# Patient Record
Sex: Female | Born: 1950 | Race: White | Hispanic: No
Health system: Midwestern US, Academic
[De-identification: ages and names within clinical notes are randomized; demographics above are authoritative.]

---

## 2017-06-04 ENCOUNTER — Encounter: Admit: 2017-06-04 | Discharge: 2017-06-04 | Payer: MEDICARE | Primary: Family

## 2017-06-04 ENCOUNTER — Ambulatory Visit: Admit: 2017-06-04 | Discharge: 2017-06-05 | Payer: MEDICARE | Primary: Family

## 2017-06-04 DIAGNOSIS — I10 Essential (primary) hypertension: Principal | ICD-10-CM

## 2017-06-04 DIAGNOSIS — R002 Palpitations: ICD-10-CM

## 2017-06-04 DIAGNOSIS — I2510 Atherosclerotic heart disease of native coronary artery without angina pectoris: ICD-10-CM

## 2017-06-04 DIAGNOSIS — I745 Embolism and thrombosis of iliac artery: ICD-10-CM

## 2017-06-04 NOTE — Progress Notes
Date of Service: 06/04/2017    Joanne Flynn is a 66 y.o. female.       HPI     Joanne Flynn is followed for coronary artery disease and labile hypertension.??? In the past she has had a tendency to have labile hypertension and her blood pressure tends to increase when she is under stress or when she has episodic left mid abdominal discomfort from time to time.  She is now tolerating ramipril 2.5 mg daily without difficulty.  Her blood pressure averaged tends to be very good with a systolic blood pressure of approximately 120 mmHg.  However, she still reports ranges where her systolic blood pressure may be 90 or may be 140 mmHg.  Otherwise, over the past 6 months, Joanne Flynn has been doing well and she reports no angina, congestive symptoms, sensation of sustained forceful heart pounding, lightheadedness or syncope.  She reports momentary, chronic, mild palpitations mostly related to anxiety.  She reports that her exercise tolerance has been stable but I do not believe that she has been walking much for exercise recently.???  She states that the greatest limitation for regular exercise is her chronic low back discomfort.  I am not sure that walking bothers her low back discomfort very much.  The patient reports no myalgias, bleeding abnormalities, neurologic motor abnormalities or difficulty with speech.   ???  Historically, in May 2002 Joanne Flynn presented with chest discomfort and underwent coronary angiography on 12/04/2000. This revealed a 50 percent to 60 percent stenosis in the obtuse marginal branch of the left circumflex coronary artery which did not require intervention. 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 She underwent right iliac thrombectomy and patch angioplasty of the right common femoral artery. When I saw her in April of 2009 she described episodic tiredness and fatigue with certain activities such as mowing four acres. An exercise echo stress test was performed at that time which revealed no evidence for ischemia. ???  When I saw Joanne Flynn on 09/04/09 she reported a chronic hurting in her cervical neck area, upper back and right shoulder. It gradually improved with physical therapy. A regadenoson Sestamibi stress study was performed in April 2011 and was reported to be probably normal. It demonstrates a small area of partially fixed, partially reversible basal mid inferoseptal decreased tracer uptake. There is increased intensity extracardiac uptake noted which may limit the counts in the inferior segments.   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 She reports that was diagnosed with another basal cell carcinoma on her right thigh and received radiation therapy. She was seen in the Emergency room on 01/19/13 for circumferential head pressure and was noted to be hypertensive. She was given amlodipine 5 mg for a one time dose. 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 She has received steroid injections for A Dupuytren's contracture. ???I notice that she was seen in urgent care on 08/03/14 for head congestion, on 08/23/14 for bronchitis and on 09/30/14 for sinus drainage and fever. Joanne Flynn was seen in Urgent Care on 04/16/15 for hypertension. She reports that she had undergone evaluation for her abdominal discomfort but no specific diagnosis has been found. Her abdomen is tender when she has the discomfort and it is possible that it  is related to diverticulitis since she reports a known diagnosis of diverticulosis.          Vitals:    06/04/17 0905 06/04/17 0918   BP: 152/82 142/84   Pulse: 72    Weight: 56.4 kg (124 lb 6.4 oz)    Height: 1.651 m (5' 5)      Body mass index is 20.7 kg/m???. Past Medical History  Patient Active Problem List    Diagnosis Date Noted   ??? Coronary artery disease 03/14/2009     May 2002 presented with chest discomfort and underwent coronary angiography on 12/04/2000. This revealed a 50-60 percent stenosis in the obtuse marginal branch of the left circumflex coronary artery which did not require intervention. Post procedure complication with total occlusion of the right external iliac artery which required surgical repair.     ??? Palpitations 03/14/2009   ??? 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     ??? History of tobacco use 03/14/2009         Review of Systems   Constitution: Negative.   HENT: Negative.    Eyes: Negative.    Cardiovascular: Negative.    Respiratory: Positive for sputum production.    Endocrine: Positive for cold intolerance.   Hematologic/Lymphatic: Bruises/bleeds easily.   Skin: Negative.    Musculoskeletal: Positive for arthritis, back pain and neck pain.   Gastrointestinal: Negative.    Genitourinary: Negative.    Neurological: Negative.    Psychiatric/Behavioral: Negative.    Allergic/Immunologic: Negative.        Physical Exam  GENERAL: The patient is well developed, well nourished, resting comfortably and in no distress. ???  HEENT: No facial abnormalities noted.  NECK: There is no jugular venous distension. Carotids are palpable and without bruits. There is no thyroid enlargement. ???  Chest: Lung fields are clear to auscultation. There are no wheezes or crackles. ???  CV: There is a regular rhythm. The first and second heart sounds are normal. There are no murmurs, gallops or rubs. Her apical heart rate is 72???BPM. ???  ABD: The abdomen is soft and supple with normal bowel sounds. There is no hepatosplenomegaly, ascites, tenderness, masses or bruits. ???  Neuro: There are no focal motor defects. Ambulation is normal. Cognitive function appears normal. ??? Ext:???There is no edema or evidence of deep vein thrombosis. Peripheral pulses are satisfactory. Dupuytren's contracture right hand. ???  SKIN:???There are no rashes and no cellulitis ???  PSYCH:???The patient is calm, rationale and oriented.     Cardiovascular Studies  A twelve-lead ECG was obtained on 12/04/2016 reveals normal sinus rhythm with a heart rate of 74 bpm.  There is no evidence of myocardial ischemia or infarction.  Labs from 09/02/2016 reveals serum creatinine 0.81 mg/dL and serum potassium 4.2 mmol/L.  Her TSH is 3.15 which is normal.  Her total cholesterol is 189, triglycerides 54, HDL 77 and LDL 92 mg/dL.  Her ALT is 14.    Problems Addressed Today  Retention.  Palpitations.  Assessment and Plan     Joanne Flynn is presently feeling well and reports no angina or congestive symptoms. Her palpitations are not problematic.  Her blood pressure average now appears well-controlled.  The patient reports that she is very sensitive to medications and is not interested in lipid-lowering therapy.  Support stockings were recommended for when her blood pressure is low. I have asked the patient to continue to keep a  log book of her BP readings and to report systolic BP readings exceeding 130 mm Hg. ???I have asked her to return for follow-up in 6???months.         Current Medications (including today's revisions)  ??? aspirin EC 81 mg PO tablet Take 81 mg by mouth Daily.   ??? Bifidobacterium infantis (ALIGN PO) Take  by mouth.   ??? ramipril (ALTACE) 2.5 mg capsule TAKE 1 CAPSULE EVERY DAY

## 2017-07-06 ENCOUNTER — Encounter: Admit: 2017-07-06 | Discharge: 2017-07-06 | Payer: MEDICARE | Primary: Family

## 2017-07-06 MED ORDER — RAMIPRIL 2.5 MG PO CAP
ORAL_CAPSULE | Freq: Every day | ORAL | 3 refills | Status: AC
Start: 2017-07-06 — End: 2017-09-17

## 2017-09-17 ENCOUNTER — Encounter: Admit: 2017-09-17 | Discharge: 2017-09-17 | Payer: MEDICARE | Primary: Family

## 2017-09-17 MED ORDER — RAMIPRIL 2.5 MG PO CAP
5 mg | ORAL_CAPSULE | Freq: Every day | ORAL | 1 refills | Status: AC
Start: 2017-09-17 — End: 2017-11-24

## 2017-09-29 ENCOUNTER — Encounter: Admit: 2017-09-29 | Discharge: 2017-09-29 | Payer: MEDICARE | Primary: Family

## 2017-11-19 LAB — COMPREHENSIVE METABOLIC PANEL
Lab: 0.5
Lab: 0.8
Lab: 105
Lab: 137
Lab: 17
Lab: 2
Lab: 21
Lab: 22 — ABNORMAL HIGH (ref 27.0–31.0)
Lab: 3.7
Lab: 30
Lab: 49
Lab: 7.1
Lab: 9.2
Lab: 91
Lab: >60
Lab: >60

## 2017-11-19 LAB — THYROID STIMULATING HORMONE-TSH: Lab: 3.8

## 2017-11-19 LAB — LIPID PROFILE
Lab: 101
Lab: 172
Lab: 2
Lab: 84 — ABNORMAL LOW (ref 90–129)
Lab: 88 — ABNORMAL HIGH (ref 40–60)

## 2017-11-19 LAB — CBC: Lab: 5.3

## 2017-11-24 ENCOUNTER — Encounter: Admit: 2017-11-24 | Discharge: 2017-11-24 | Payer: MEDICARE | Primary: Family

## 2017-11-24 ENCOUNTER — Ambulatory Visit: Admit: 2017-11-24 | Discharge: 2017-11-25 | Payer: MEDICARE | Primary: Family

## 2017-11-24 DIAGNOSIS — I2510 Atherosclerotic heart disease of native coronary artery without angina pectoris: ICD-10-CM

## 2017-11-24 DIAGNOSIS — I745 Embolism and thrombosis of iliac artery: ICD-10-CM

## 2017-11-24 DIAGNOSIS — R002 Palpitations: Principal | ICD-10-CM

## 2017-11-24 MED ORDER — RAMIPRIL 2.5 MG PO CAP
5 mg | ORAL_CAPSULE | Freq: Every day | ORAL | 3 refills | Status: AC
Start: 2017-11-24 — End: 2019-01-27

## 2017-11-24 MED ORDER — AMLODIPINE 2.5 MG PO TAB
2.5 mg | ORAL_TABLET | Freq: Every day | ORAL | 3 refills | Status: AC
Start: 2017-11-24 — End: 2018-05-20

## 2018-05-05 ENCOUNTER — Encounter: Admit: 2018-05-05 | Discharge: 2018-05-05 | Payer: MEDICARE | Primary: Family

## 2018-05-20 ENCOUNTER — Ambulatory Visit: Admit: 2018-05-20 | Discharge: 2018-05-21 | Payer: MEDICARE | Primary: Family

## 2018-05-20 DIAGNOSIS — I10 Essential (primary) hypertension: Principal | ICD-10-CM

## 2018-05-21 ENCOUNTER — Encounter: Admit: 2018-05-21 | Discharge: 2018-05-21 | Payer: MEDICARE | Primary: Family

## 2018-12-21 ENCOUNTER — Encounter: Admit: 2018-12-21 | Discharge: 2018-12-21 | Primary: Family

## 2018-12-21 NOTE — Telephone Encounter
Patient c/o of some recent cramping in toes/legs would like to take magnesium supplement.  She is going to start slow mag unless [facility] objects.

## 2019-01-26 ENCOUNTER — Encounter: Admit: 2019-01-26 | Discharge: 2019-01-26 | Primary: Family

## 2019-01-27 MED ORDER — RAMIPRIL 2.5 MG PO CAP
ORAL_CAPSULE | Freq: Every day | ORAL | 0 refills | Status: DC
Start: 2019-01-27 — End: 2019-08-22

## 2019-05-19 ENCOUNTER — Encounter: Admit: 2019-05-19 | Discharge: 2019-05-19 | Payer: MEDICARE | Primary: Family

## 2019-05-19 DIAGNOSIS — I2510 Atherosclerotic heart disease of native coronary artery without angina pectoris: Secondary | ICD-10-CM

## 2019-05-19 DIAGNOSIS — R002 Palpitations: Secondary | ICD-10-CM

## 2019-05-19 DIAGNOSIS — I745 Embolism and thrombosis of iliac artery: Secondary | ICD-10-CM

## 2019-05-19 NOTE — Progress Notes
Date of Service: 05/19/2019    Joanne Flynn is a 68 y.o. female.       HPI     Joanne Flynn is followed for coronary artery disease and labile hypertension.? In the past she has had a tendency to have labile hypertension and her blood pressure tends to increase when she is under stress or when she has abdominal discomfort from time to time.  Her blood pressure has been well controlled over the past 6 months with the majority of her blood pressure readings less than 130/80 mmHg.  One blood pressure reading of 109/57 mm Hg with a pulse of 69 BPM  was recorded on April 23, 2019 but no other hypotensive episodes were recorded and no symptomatic hypotension is reported.. Joanne Flynn reports having sinusitis in November 2019 which persisted for 3 weeks but no other illnesses over the past year. Over the past 6 months her blood pressures been very well controlled and she has not had to take supplemental amlodipine.  She continues to take ramapril daily and reports no adverse effects to this medication.?  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  She believes that she has a tendency to chronic/recurrent bladder infections.?Otherwise, over the past 6 months, Joanne Flynn has been doing well and she reports no angina, congestive symptoms, sensation of sustained forceful heart pounding, lightheadedness or syncope. ?She reports momentary, chronic, mild palpitations mostly related to anxiety, although her palpitations have been?quiescent?recently. ?She reports?that her?exercise tolerance has been very good over the past several months.  She has an aerobic workout that she performs for 20 minutes nightly and lifts light weights for 5 minutes nightly.?The patient reports no myalgias, bleeding abnormalities, strokelike symptoms or difficulty with speech.   ?  Historically, in May 2002 Joanne Flynn presented with chest discomfort and underwent coronary angiography on 12/04/2000. This revealed a 50 percent to 60 percent stenosis in the obtuse marginal branch of the left circumflex coronary artery which did not require intervention. 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 She underwent right iliac thrombectomy and patch angioplasty of the right common femoral artery. When I saw her in April of 2009 she described episodic tiredness and fatigue with certain activities such as mowing four acres. An exercise echo stress test was performed at that time which revealed no evidence for ischemia. ?  When I saw Joanne Flynn on 09/04/09 she reported a chronic hurting in her cervical neck area, upper back and right shoulder. It gradually improved with physical therapy. A regadenoson Sestamibi stress study was performed in April 2011 and was reported to be probably normal. It demonstrates a small area of partially fixed, partially reversible basal mid inferoseptal decreased tracer uptake. There is increased intensity extracardiac uptake noted which may limit the counts in the inferior segments.   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 She reports that was diagnosed with another basal cell carcinoma on her right thigh and received radiation therapy. She was seen in the Emergency room on 01/19/13 for circumferential head pressure and was noted to be hypertensive. She was given amlodipine 5 mg for a one time dose. 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 She has received steroid injections for  A Dupuytren's contracture. ?I notice that she was seen in urgent care on 08/03/14 for head congestion, on 08/23/14 for bronchitis and on 09/30/14 for sinus drainage and fever. Joanne Flynn was seen in Urgent Care on 04/16/15 for hypertension. She reports that she had undergone evaluation for her abdominal discomfort but no specific diagnosis has been found. Her abdomen is tender when she has the discomfort and it is possible that it is related to diverticulitis since she reports a known diagnosis of diverticulosis.          Vitals:    05/19/19 1248   BP: 132/80   BP Source: Arm, Left Upper   Pulse: 66   Temp: 36.7 ?C (98.1 ?F)   SpO2: 98%   Weight: 56 kg (123 lb 6.4 oz)   Height: 1.651 m (5' 5)   PainSc: Zero     Body mass index is 20.53 kg/m?Marland Kitchen     Past Medical History  Patient Active Problem List    Diagnosis Date Noted   ? Coronary artery disease 03/14/2009     May 2002 presented with chest discomfort and underwent coronary angiography on 12/04/2000. This revealed a 50-60 percent stenosis in the obtuse marginal branch of the left circumflex coronary artery which did not require intervention. Post procedure complication with total occlusion of the right external iliac artery which required surgical repair.     ? Palpitations 03/14/2009   ? 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     ? History of tobacco use 03/14/2009         Review of Systems   Constitution: Negative.   HENT: Negative.    Eyes: Negative.    Cardiovascular: Negative.    Respiratory: Negative.    Endocrine: Negative.    Hematologic/Lymphatic: Negative.    Skin: Negative.    Musculoskeletal: Negative.    Gastrointestinal: Positive for heartburn.   Genitourinary: Negative.    Neurological: Positive for dizziness and light-headedness.   Psychiatric/Behavioral: Negative.    Allergic/Immunologic: Negative.        Physical Exam  GENERAL: The patient is well developed, well nourished, resting comfortably and in no distress. ?  HEENT: No facial abnormalities noted. NECK: There is no jugular venous distension. Carotids are palpable and without bruits. There is no thyroid enlargement. ?  Chest: Lung fields are clear to auscultation. There are no wheezes or crackles. ?  CV: There is a regular rhythm. The first and second heart sounds are normal. There are no murmurs, gallops or rubs. Her apical heart rate is 60?BPM. ?  ABD: The abdomen is soft and supple with normal bowel sounds. There is no hepatosplenomegaly, ascites, tenderness, masses or bruits. ?  Neuro: There are no focal motor defects. Ambulation is normal. Cognitive function appears normal. ?  Ext:?There is no edema or evidence of deep vein thrombosis. Peripheral pulses are satisfactory. Dupuytren's contracture right hand. ?  SKIN:?There are no rashes and no cellulitis ?  PSYCH:?The patient is calm, rationale and oriented.     Cardiovascular Studies  A twelve-lead ECG was obtained on 05/19/2019 and reveals normal sinus rhythm with a heart rate of 59 bpm.  There is no evidence for myocardial ischemia or infarction.  Labs from 04/29/2019 revealed serum creatinine 0.90 mg/dL and TSH 1.61 microunits/L.    Problems Addressed Today  Encounter Diagnoses   Name Primary?   ? Coronary artery disease involving native coronary artery of native  heart without angina pectoris Yes       Assessment and Plan     Ms. Ofallon's?blood pressure appears currently well controlled. I have asked the patient to keep a log book of her?BP readings and to report systolic BP readings exceeding 130 mm Hg.  The patient believes that she is sensitive to medications and is not interested in lipid-lowering therapy.  I have asked her to return for follow-up in?12?months.         Current Medications (including today's revisions)  ? aspirin EC 81 mg PO tablet Take 81 mg by mouth Daily.   ? Bifidobacterium infantis (ALIGN PO) Take  by mouth.   ? magnesium chloride (SLOW-MAG PO) Take  by mouth.   ? MULTIVITAMIN PO Take 1 tablet by mouth daily. ? pantoprazole DR (PROTONIX) 20 mg tablet Take 20 mg by mouth daily.   ? ramipriL (ALTACE) 2.5 mg capsule TAKE 2 CAPSULES EVERY DAY

## 2019-06-08 ENCOUNTER — Ambulatory Visit: Admit: 2019-06-08 | Discharge: 2019-06-08 | Payer: MEDICARE | Primary: Family

## 2019-06-08 ENCOUNTER — Encounter: Admit: 2019-06-08 | Discharge: 2019-06-08 | Payer: MEDICARE | Primary: Family

## 2019-06-08 DIAGNOSIS — R079 Chest pain, unspecified: Secondary | ICD-10-CM

## 2019-06-21 ENCOUNTER — Encounter: Admit: 2019-06-21 | Discharge: 2019-06-21 | Payer: MEDICARE | Primary: Family

## 2019-06-21 DIAGNOSIS — I2510 Atherosclerotic heart disease of native coronary artery without angina pectoris: Secondary | ICD-10-CM

## 2019-06-21 DIAGNOSIS — R002 Palpitations: Secondary | ICD-10-CM

## 2019-06-21 NOTE — Telephone Encounter
-----   Message from Nehemiah Massed, MD sent at 06/15/2019  4:14 PM CST -----  Regarding: RE: short stay at [facility] please schedule her for next available clinic opening.  Try to keep her in my clinic if at all possible, but do not schedule her for an 8 AM slot.  Thanks.  SBG  ----- Message -----  From: Asencion Noble  Sent: 06/15/2019   4:05 PM CST  To: Nehemiah Massed, MD  Subject: short stay at [facility] had recent admission to [facility] cp mildly elevated trop.0.015, 0.016 Normal thallium. Would you like to see her in follow up?  You are next there in Dec but are full.  If she needs to be seen sooner I can have her see [facility] or [facility].  I you want her overbooked we can put her in at 8am. Or we can just schedule her your next available.      Thanks  Richardson Landry

## 2019-08-19 ENCOUNTER — Encounter: Admit: 2019-08-19 | Discharge: 2019-08-19 | Payer: MEDICARE | Primary: Family

## 2019-08-22 MED ORDER — RAMIPRIL 2.5 MG PO CAP
ORAL_CAPSULE | Freq: Every day | ORAL | 3 refills | Status: AC
Start: 2019-08-22 — End: ?

## 2019-09-08 ENCOUNTER — Encounter: Admit: 2019-09-08 | Discharge: 2019-09-08 | Payer: MEDICARE | Primary: Family

## 2019-09-08 DIAGNOSIS — R002 Palpitations: Secondary | ICD-10-CM

## 2019-09-08 DIAGNOSIS — I2510 Atherosclerotic heart disease of native coronary artery without angina pectoris: Secondary | ICD-10-CM

## 2019-09-08 DIAGNOSIS — I745 Embolism and thrombosis of iliac artery: Secondary | ICD-10-CM

## 2019-09-08 MED ORDER — AMLODIPINE 2.5 MG PO TAB
2.5 mg | ORAL_TABLET | Freq: Every day | ORAL | 3 refills | Status: AC | PRN
Start: 2019-09-08 — End: ?

## 2019-09-16 ENCOUNTER — Encounter: Admit: 2019-09-16 | Discharge: 2019-09-16 | Payer: MEDICARE | Primary: Family

## 2019-09-16 ENCOUNTER — Ambulatory Visit: Admit: 2019-09-16 | Discharge: 2019-09-16 | Payer: MEDICARE | Primary: Family

## 2019-09-16 DIAGNOSIS — R002 Palpitations: Secondary | ICD-10-CM

## 2019-09-16 DIAGNOSIS — I2510 Atherosclerotic heart disease of native coronary artery without angina pectoris: Secondary | ICD-10-CM

## 2019-09-19 ENCOUNTER — Encounter: Admit: 2019-09-19 | Discharge: 2019-09-19 | Payer: MEDICARE | Primary: Family

## 2019-09-19 NOTE — Telephone Encounter
-----   Message from Hester Mates, MD sent at 09/19/2019  2:03 PM CST -----  Franchot Mimes: Favorable echo Doppler study.  Please let her know.  Thanks.  SBG  ----- Message -----  From: Hajj, Alfredo Martinez, MD  Sent: 09/16/2019  11:37 PM CST  To: Hester Mates, MD

## 2020-01-25 ENCOUNTER — Encounter: Admit: 2020-01-25 | Discharge: 2020-01-25 | Payer: MEDICARE | Primary: Family

## 2020-01-31 ENCOUNTER — Encounter: Admit: 2020-01-31 | Discharge: 2020-01-31 | Payer: MEDICARE | Primary: Family

## 2020-01-31 DIAGNOSIS — I2510 Atherosclerotic heart disease of native coronary artery without angina pectoris: Secondary | ICD-10-CM

## 2020-01-31 DIAGNOSIS — R002 Palpitations: Secondary | ICD-10-CM

## 2020-01-31 DIAGNOSIS — I745 Embolism and thrombosis of iliac artery: Secondary | ICD-10-CM

## 2020-01-31 NOTE — Progress Notes
Date of Service: 01/31/2020    Joanne Flynn is a 69 y.o. female.       HPI     Joanne Flynn is followed for coronary artery disease and labile hypertension.? Her blood pressure has been very well controlled over the past 5 months. In the past she has had a tendency to have labile hypertension and her blood pressure tends to increase when she is under stress or when she has abdominal discomfort from time to time.?She continues to take low-dose?ramapril daily?and?reports no adverse effects to this medication.?  She has been having discomfort in her right knee and is considering arthroscopic right knee surgery.in addition, she has cervical neck arthritis and muscle spasm for which she sees a Land. ?She believes that she has a tendency to chronic/recurrent bladder infections.?Otherwise, over the past 5 months, Joanne Flynn has been doing well and she reports no angina, congestive symptoms, sensation of sustained forceful heart pounding, lightheadedness or syncope. ?She reports momentary, chronic, mild palpitations mostly related to anxiety, although her palpitations have been?quiescent?recently. ?She reports?that her?exercise tolerance has been? stable over the past several months.  Her right knee discomfort has been limiting her exercise to some extent.  The patient reports no myalgias, bleeding abnormalities,? or strokelike symptoms. Ms. [name] reports no history of myocardial infarction, congestive heart failure, renal insufficiency, diabetes mellitus, transient ischemic attack or stroke  ?  Historically, in May 2002 Joanne Flynn presented with chest discomfort and underwent coronary angiography on 12/04/2000. This revealed a 50 percent to 60 percent stenosis in the obtuse marginal branch of the left circumflex coronary artery which did not require intervention. 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 She underwent right iliac thrombectomy and patch angioplasty of the right common femoral artery. When I saw her in April of 2009 she described episodic tiredness and fatigue with certain activities such as mowing four acres. An exercise echo stress test was performed at that time which revealed no evidence for ischemia. ?  When I saw Joanne Flynn on 09/04/09 she reported a chronic hurting in her cervical neck area, upper back and right shoulder. It gradually improved with physical therapy. A regadenoson Sestamibi stress study was performed in April 2011 and was reported to be probably normal. It demonstrates a small area of partially fixed, partially reversible basal mid inferoseptal decreased tracer uptake. There is increased intensity extracardiac uptake noted which may limit the counts in the inferior segments.   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 She reports that was diagnosed with another basal cell carcinoma on her right thigh and received radiation therapy. She was seen in the Emergency room on 01/19/13 for circumferential head pressure and was noted to be hypertensive. She was given amlodipine 5 mg for a one time dose. 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 She has received steroid injections for A Dupuytren's contracture. ?I notice that she was seen in urgent care on 08/03/14 for head congestion, on 08/23/14 for bronchitis and on 09/30/14 for sinus drainage and fever. Joanne Flynn was seen in Urgent Care on 04/16/15 for hypertension. She reports that she had undergone evaluation for her abdominal discomfort but no specific diagnosis has been found. Her abdomen is tender when she has the discomfort and it is  possible that it is related to diverticulitis since she reports a known diagnosis of diverticulosis.?She had 2 episodes in the winter of 2020-2021 where her blood pressure spiked and she sought evaluation and treatment in the emergency room.  The first one occurred on June 07, 2019 and the second 1 occurred approximately 2 weeks later.  During her evaluation in the emergency room on June 07, 2019 her troponin was minimally elevated a stress test was performed the next day.           Vitals:    01/31/20 1143   BP: 138/88   BP Source: Arm, Left Upper   Patient Position: Sitting   Pulse: 68   SpO2: 98%   Weight: 56.1 kg (123 lb 9.6 oz)   Height: 1.651 m (5' 5)   PainSc: Zero     Body mass index is 20.57 kg/m?Marland Kitchen     Past Medical History  Patient Active Problem List    Diagnosis Date Noted   ? Coronary artery disease 03/14/2009     May 2002 presented with chest discomfort and underwent coronary angiography on 12/04/2000. This revealed a 50-60 percent stenosis in the obtuse marginal branch of the left circumflex coronary artery which did not require intervention. Post procedure complication with total occlusion of the right external iliac artery which required surgical repair.     ? Palpitations 03/14/2009   ? 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     ? History of tobacco use 03/14/2009         Review of Systems   Constitution: Negative.   HENT: Negative.    Eyes: Negative.    Cardiovascular: Negative.    Respiratory: Negative.    Endocrine: Negative.    Hematologic/Lymphatic: Negative.    Skin: Negative.    Musculoskeletal: Negative.    Gastrointestinal: Negative.    Genitourinary: Negative.    Neurological: Negative.    Psychiatric/Behavioral: Negative.    Allergic/Immunologic: Negative.        Physical Exam  GENERAL: The patient is well developed, well nourished, resting comfortably and in no distress. ?  HEENT: No facial abnormalities noted.  NECK: There is no jugular venous distension. Carotids are palpable and without bruits. There is no thyroid enlargement. ?  Chest: Lung fields are clear to auscultation. There are no wheezes or crackles. ?  CV: There is a regular rhythm. The first and second heart sounds are normal. There are no murmurs, gallops or rubs. Her apical heart rate is?60?BPM. ?  ABD: The abdomen is soft and supple with normal bowel sounds. There is no hepatosplenomegaly, ascites, tenderness, masses or bruits. ?  Neuro: There are no focal motor defects. Ambulation is normal. Cognitive function appears normal. ?  Ext:?There is no edema or evidence of deep vein thrombosis. Peripheral pulses are satisfactory. Dupuytren's contracture right hand. ?  SKIN:?There are no rashes and no cellulitis ?  PSYCH:?The patient is calm, rationale and oriented.?    Cardiovascular Studies  A twelve-lead ECG was obtained on 05/19/2019?and?reveals normal sinus rhythm with a heart rate of 59 bpm. ?There is no evidence for myocardial ischemia or infarction. ?Labs from 04/29/2019 revealed serum creatinine 0.90 mg/dL and TSH 0.96 microunits/L.  ?  Labs from a 11/28/2019 revealed hemoglobin 15.6 g%, potassium 4.6 mmol/l and serum creatinine 0.90 mg/dl.  Her total cholesterol was 189, triglycerides 89, HDL 90 and LDL cholesterol 81 mg/dl.    Echo Doppler  study 09/16/2019:  Echocardiographic Findings    Left Ventricle The left ventricular size is normal. Concentric remodeling. The left ventricular systolic function is normal. Normal left ventricular diastolic function.   Right Ventricle The right ventricular size is normal. The right ventricular wall thickness is normal. The pulmonary artery pressure could not be estimated due to inadequate tricuspid regurgitation signal.   Left Atrium Normal size.   Right Atrium Normal size.   IVC/SVC Normal central venous pressure (0-5 mm Hg).   Mitral Valve Normal valve structure. No stenosis. No regurgitation.   Tricuspid Valve Normal valve structure. No stenosis. Trace regurgitation.   Aortic Valve Tricuspid aortic valve present No stenosis. No regurgitation.   Pulmonary The pulmonic valve was not seen well but no Doppler evidence of stenosis. No regurgitation.   Aorta The aortic root and ascending aorta are normal in size.   Pericardium No pericardial effusion.         Regadenoson thallium stress test June 08, 2019:  Scintigraphic (planar/tomographic):??There is normal homogenous uptake of thallium in all myocardial segments. ?There are no perfusion defects. ?All myocardial segments appear viable. Polar coordinate map identifies no perfusion abnormalities. ?  TID Ratio: ?0.96 ?(normal <1.36). Summed Stress Score: ?0, Summed Rest Score: ?0. Regional Wall Thickening and Motion Post Stress: ??There is normal left ventricular wall motion and thickening of all myocardial segments. Left Ventricular Ejection Fraction (post stress, in the resting state) =??78 %. Left Ventricular End Diastolic Volume: 47 mL  SUMMARY/OPINION:??This study is normal with no evidence of significant myocardial ischemia. Left ventricular systolic function is normal. There are no high risk prognostic indicators present. ?The pharmacologic ECG portion of the study is negative for ischemia. Comparison is made with a prior regadenoson thallium stress study completed 11/01/2009. ?Ejection fraction was 63 %. ?There are ?no significant changes. In aggregate the current study is low risk in regards to predicted annual cardiovascular mortality rate.    Problems Addressed Today  Encounter Diagnoses   Name Primary?   ? Coronary artery disease involving native coronary artery of native heart without angina pectoris    ? Palpitations        Assessment and Plan     Ms. Kretchmer appears stable from a cardiovascular perspective and reports no angina or congestive symptoms.  She reports that her blood pressure is very well controlled and her average blood pressure taken at home averages 120/70 mmHg.  Concerning orthoscopic knee surgery: Based upon the results of the patient's clinical profile and non-invasive cardiovascular testing, the risk for cardiovascular morbidity and mortality associated with non-cardiac surgery is INTERMEDIATE. This by no means precludes surgery but provides information for the surgeon, anesthesiologist and patient concerning the risks of surgery.  A decision to proceed with surgery should be made based upon the relative benefits and risks involved. Indeed, orthopedic surgery could be a very helpful intervention for this patient's overall health maintenance and could prove very helpful for improvement in her exercise tolerance and cardiovascular stability. There are no absolute contra-indications to elective surgery, as deemed necessary. Ms. Gladson will have to accept the responsibility and risks of holding her aspirin in the perioperative period if required by her orthopedic surgery team.  No additional cardiovascular testing is required at this time.  I have asked her to return for follow-up in 1 years time.         Current Medications (including today's revisions)  ? amLODIPine (NORVASC) 2.5 mg tablet Take one tablet by mouth daily as needed. For systolic  bp greater than 150 for 2hours   ? aspirin EC 81 mg PO tablet Take 81 mg by mouth Daily.   ? Bifidobacterium infantis (ALIGN PO) Take  by mouth.   ? MULTIVITAMIN PO Take 1 tablet by mouth daily.   ? pantoprazole DR (PROTONIX) 20 mg tablet Take 20 mg by mouth daily.   ? ramipriL (ALTACE) 2.5 mg capsule TAKE 2 CAPSULES EVERY DAY

## 2020-05-09 ENCOUNTER — Encounter: Admit: 2020-05-09 | Discharge: 2020-05-09 | Payer: MEDICARE | Primary: Family

## 2020-05-09 MED ORDER — RAMIPRIL 2.5 MG PO CAP
2.5 mg | ORAL_CAPSULE | Freq: Every day | ORAL | 3 refills | Status: AC
Start: 2020-05-09 — End: ?

## 2021-02-04 ENCOUNTER — Encounter: Admit: 2021-02-04 | Discharge: 2021-02-04 | Payer: MEDICARE | Primary: Family

## 2021-02-05 ENCOUNTER — Encounter: Admit: 2021-02-05 | Discharge: 2021-02-05 | Payer: MEDICARE | Primary: Family

## 2021-02-05 DIAGNOSIS — I2510 Atherosclerotic heart disease of native coronary artery without angina pectoris: Secondary | ICD-10-CM

## 2021-02-05 DIAGNOSIS — I745 Embolism and thrombosis of iliac artery: Secondary | ICD-10-CM

## 2021-02-05 DIAGNOSIS — R002 Palpitations: Secondary | ICD-10-CM

## 2021-02-05 DIAGNOSIS — I10 Essential (primary) hypertension: Secondary | ICD-10-CM

## 2021-02-05 NOTE — Progress Notes
Date of Service: 02/05/2021    Joanne Flynn is a 70 y.o. female.       HPI    Joanne Flynn is followed for coronary artery disease and labile hypertension.  The patient reports having COVID illness in November 2021 and in January 2022.  Evidently she was told she had different variants.  She received an infusion during her illness in November 2021. In the past she has had a tendency to have labile hypertension and her blood pressure tends to increase when she is under stress or when she has abdominal discomfort from time to time.?She continues to take?low-dose?ramapril daily?and?reports no adverse effects to this medication.  Joanne Flynn charts her blood pressure and all of her blood pressures appear to be very well controlled.  Infrequently she will notice low blood pressure sometimes associated with mild lightheadedness and may skip her dose of ramipril. She has cervical neck arthritis and muscle spasm for which she sees a Land. ?She believes that she has a tendency to chronic/recurrent bladder infections.?Otherwise, over the past year, Joanne Flynn has been doing well and she reports no angina, congestive symptoms, palpitations, sensation of sustained forceful heart pounding, presyncope or syncope.?She reports?that her?exercise tolerance has improved over the past year.  She has been speed walking at [facility] for an hour 4 times a week.  The patient reports no myalgias, bleeding abnormalities,?or strokelike symptoms. Joanne Flynn reports no history of myocardial infarction, congestive heart failure, renal insufficiency, diabetes mellitus, transient ischemic attack or stroke  ?  Historically, in May 2002 Joanne Flynn presented with chest discomfort and underwent coronary angiography on 12/04/2000. This revealed a 50 percent to 60 percent stenosis in the obtuse marginal branch of the left circumflex coronary artery which did not require intervention. 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 She underwent right iliac thrombectomy and patch angioplasty of the right common femoral artery. When I saw her in April of 2009 she described episodic tiredness and fatigue with certain activities such as mowing four acres. An exercise echo stress test was performed at that time which revealed no evidence for ischemia. ?  When I saw Joanne Flynn on 09/04/09 she reported a chronic hurting in her cervical neck area, upper back and right shoulder. It gradually improved with physical therapy. A regadenoson Sestamibi stress study was performed in April 2011 and was reported to be probably normal. It demonstrates a small area of partially fixed, partially reversible basal mid inferoseptal decreased tracer uptake. There is increased intensity extracardiac uptake noted which may limit the counts in the inferior segments.   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 She reports that was diagnosed with another basal cell carcinoma on her right thigh and received radiation therapy. She was seen in the Emergency room on 01/19/13 for circumferential head pressure and was noted to be hypertensive. She was given amlodipine 5 mg for a one time dose. 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 She has received steroid injections for A Dupuytren's contracture. ?I notice that she was seen in urgent care on 08/03/14 for head congestion, on 08/23/14 for bronchitis and on 09/30/14 for sinus drainage and fever. Joanne Flynn was seen in Urgent Care on 04/16/15 for hypertension. She reports that she had undergone  evaluation for her abdominal discomfort but no specific diagnosis has been found. Her abdomen is tender when she has the discomfort and it is possible that it is related to diverticulitis since she reports a known diagnosis of diverticulosis.?She had 2 episodes in the winter of 2020-2021 where her blood pressure spiked and she sought evaluation and treatment in the emergency room. ?The first one?occurred on June 07, 2019 and the second 1 occurred approximately 2 weeks later. ?During her evaluation in the emergency room on June 07, 2019 her troponin was minimally elevated a stress test was performed the next day. ?       Vitals:    02/05/21 1247 02/05/21 1259   BP: (!) 150/84 (!) 148/82   BP Source: Arm, Left Upper Arm, Right Upper   Pulse: 60    SpO2: 100%    O2 Device: None (Room air)    PainSc: Zero    Weight: 53.6 kg (118 lb 3.2 oz)    Height: 162.6 cm (5' 4)      Body mass index is 20.29 kg/m?Marland Kitchen     Past Medical History  Patient Active Problem List    Diagnosis Date Noted   ? Coronary artery disease 03/14/2009     May 2002 presented with chest discomfort and underwent coronary angiography on 12/04/2000. This revealed a 50-60 percent stenosis in the obtuse marginal branch of the left circumflex coronary artery which did not require intervention. Post procedure complication with total occlusion of the right external iliac artery which required surgical repair.     ? Palpitations 03/14/2009   ? 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     ? History of tobacco use 03/14/2009         Review of Systems   Constitutional: Negative.   HENT: Negative.    Eyes: Negative.    Cardiovascular: Negative.    Respiratory: Negative.    Endocrine: Negative.    Hematologic/Lymphatic: Negative.    Skin: Negative.    Musculoskeletal: Negative.    Gastrointestinal: Negative.    Genitourinary: Negative.    Neurological: Negative.    Psychiatric/Behavioral: Negative.    Allergic/Immunologic: Negative.        Physical Exam  GENERAL: The patient is well developed, well nourished, resting comfortably and in no distress. ?  HEENT: No facial abnormalities noted.  NECK: There is no jugular venous distension. Carotids are palpable and without bruits. There is no thyroid enlargement. ?  Chest: Lung fields are clear to auscultation. There are no wheezes or crackles. ?  CV: There is a regular rhythm. The first and second heart sounds are normal. There are no murmurs, gallops or rubs. Her apical heart rate is?60?BPM. ?  ABD: The abdomen is soft and supple with normal bowel sounds. There is no hepatosplenomegaly, ascites, tenderness, masses or bruits. ?  Neuro: There are no focal motor defects. Ambulation is normal. Cognitive function appears normal. ?  Ext:?There is no edema or evidence of deep vein thrombosis. Peripheral pulses are satisfactory. Dupuytren's contracture right hand. ?  SKIN:?There are no rashes and no cellulitis ?  PSYCH:?The patient is calm, rationale and oriented.?    Cardiovascular Studies  A twelve-lead ECG obtained on 02/05/2021 reveals normal sinus rhythm with a heart rate of 59 bpm.  There is no evidence of myocardial ischemia or infarction.  Labs from 12/04/2020 revealed hemoglobin 14.8 g% and platelet count 233.  Her potassium was  5.0 mmol/L and serum creatinine 0.88 mg/dL.  Her ALT = 20.  Her total cholesterol was 197, triglycerides 68, HDL 81 and LDL cholesterol 102 mg/dL.  Her TSH was 2.96.    Echo Doppler study 09/16/2019:  Echocardiographic Findings  ?  Left Ventricle The left ventricular size is normal. Concentric remodeling. The left ventricular systolic function is normal. Normal left ventricular diastolic function.   Right Ventricle The right ventricular size is normal. The right ventricular wall thickness is normal. The pulmonary artery pressure could not be estimated due to inadequate tricuspid regurgitation signal.   Left Atrium Normal size.   Right Atrium Normal size.   IVC/SVC Normal central venous pressure (0-5 mm Hg).   Mitral Valve Normal valve structure. No stenosis. No regurgitation. Tricuspid Valve Normal valve structure. No stenosis. Trace regurgitation.   Aortic Valve Tricuspid aortic valve present ?No stenosis. No regurgitation.   Pulmonary The pulmonic valve was not seen well but no Doppler evidence of stenosis. No regurgitation.   Aorta The aortic root and ascending aorta are normal in size.   Pericardium No pericardial effusion.   ?  ?  ?  Regadenoson thallium stress test June 08, 2019:  Scintigraphic (planar/tomographic):??There is normal homogenous uptake of thallium in all myocardial segments. ?There are no perfusion defects. ?All myocardial segments appear viable. Polar coordinate map identifies no perfusion abnormalities. ?  TID Ratio: ?0.96 ?(normal <1.36). Summed Stress Score: ?0, Summed Rest Score: ?0.?Regional Wall Thickening and Motion Post Stress: ??There is normal left ventricular wall motion and thickening of all myocardial segments. Left Ventricular Ejection Fraction (post stress, in the resting state) =??78 %. Left Ventricular End Diastolic Volume: 47 mL  SUMMARY/OPINION:??This study is normal with no evidence of significant myocardial ischemia. Left ventricular systolic function is normal. There are no high risk prognostic indicators present. ?The pharmacologic ECG portion of the study is negative for ischemia. Comparison is made with a prior regadenoson thallium stress study completed 11/01/2009. ?Ejection fraction was 63 %. ?There are ?no significant changes. In aggregate the current study is low risk in regards to predicted annual cardiovascular mortality rate.    Cardiovascular Health Factors  Vitals BP Readings from Last 3 Encounters:   02/05/21 (!) 148/82   01/31/20 138/88   09/16/19 (!) 175/97     Wt Readings from Last 3 Encounters:   02/05/21 53.6 kg (118 lb 3.2 oz)   01/31/20 56.1 kg (123 lb 9.6 oz)   09/16/19 56.4 kg (124 lb 6.4 oz)     BMI Readings from Last 3 Encounters:   02/05/21 20.29 kg/m?   01/31/20 20.57 kg/m?   09/16/19 20.70 kg/m?      Smoking Social History     Tobacco Use   Smoking Status Former Smoker   Smokeless Tobacco Never Used   Tobacco Comment    early 70's      Lipid Profile Cholesterol   Date Value Ref Range Status   11/28/2019 189  Final     HDL   Date Value Ref Range Status   11/28/2019 90  Final     LDL   Date Value Ref Range Status   11/28/2019 81  Final     Triglycerides   Date Value Ref Range Status   11/28/2019 89  Final      Blood Sugar No results found for: HGBA1C  Glucose   Date Value Ref Range Status   11/28/2019 94  Final   11/19/2017 91  Final   09/02/2016 100  Final          Problems Addressed Today  Coronary artery disease.  Essential hypertension.  Assessment and Plan     Ms. Sappenfield appears stable from a cardiovascular perspective and reports no angina or congestive symptoms. She reports that her blood pressure is very well controlled when her blood pressure is taken at home and averages 120/70 mmHg, sometimes lower.  She takes aspirin because she has known coronary disease.  She politely declines any type of cholesterol-lowering medication. I have asked the patient to keep a log book of her BP readings and to report BP readings exceeding 130/80 mm Hg.  I have asked her to return for follow-up in 1 years time.            Current Medications (including today's revisions)  ? aspirin EC 81 mg PO tablet Take 81 mg by mouth Daily.   ? Bifidobacterium infantis (ALIGN PO) Take  by mouth.   ? BIOTIN PO Take  by mouth.   ? MULTIVITAMIN PO Take 1 tablet by mouth daily.   ? ramipriL (ALTACE) 2.5 mg capsule Take one capsule by mouth daily.

## 2021-05-22 ENCOUNTER — Encounter: Admit: 2021-05-22 | Discharge: 2021-05-22 | Payer: MEDICARE | Primary: Family

## 2021-05-22 MED ORDER — RAMIPRIL 2.5 MG PO CAP
ORAL_CAPSULE | Freq: Every day | ORAL | 3 refills | Status: AC
Start: 2021-05-22 — End: ?

## 2022-01-29 ENCOUNTER — Encounter: Admit: 2022-01-29 | Discharge: 2022-01-29 | Payer: MEDICARE | Primary: Family

## 2022-02-04 ENCOUNTER — Encounter: Admit: 2022-02-04 | Discharge: 2022-02-04 | Payer: MEDICARE | Primary: Family

## 2022-02-04 DIAGNOSIS — I2510 Atherosclerotic heart disease of native coronary artery without angina pectoris: Secondary | ICD-10-CM

## 2022-02-04 DIAGNOSIS — I745 Embolism and thrombosis of iliac artery: Secondary | ICD-10-CM

## 2022-02-04 DIAGNOSIS — Z136 Encounter for screening for cardiovascular disorders: Secondary | ICD-10-CM

## 2022-02-04 DIAGNOSIS — R002 Palpitations: Secondary | ICD-10-CM

## 2022-02-04 NOTE — Patient Instructions
Thank you for visiting our office today.    We would like to make the following medication adjustments:  NONE       Otherwise continue the same medications as you have been doing.          We will be pursuing the following tests after your appointment today:       Orders Placed This Encounter    ECG 12-LEAD         We will plan to see you back in 12 months.  Please call us in the meantime with any questions or concerns.        Please allow 5-7 business days for our providers to review your results. All normal results will go to MyChart. If you do not have Mychart, it is strongly recommended to get this so you can easily view all your results. If you do not have mychart, we will attempt to call you once with normal lab and testing results. If we cannot reach you by phone with normal results, we will send you a letter.  If you have not heard the results of your testing after one week please give us a call.       Your Cardiovascular Medicine [place] Team (Steve, Lisa, Jamie, Melanie, and Jelani Vreeland)  phone number is [phone].

## 2022-02-04 NOTE — Progress Notes
Date of Service: 02/04/2022    Joanne Flynn is a 71 y.o. female.       HPI    Joanne Flynn is followed for coronary artery disease and labile hypertension.   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  Since the general anesthesia she has had some diffuse numbness and tingling in her extremities.  In the past she has had a tendency to have labile hypertension and her blood pressure tends to increase when she is under stress or when she has abdominal discomfort from time to time.?She continues to take?low-dose?ramapril daily?and?reports no adverse effects to this medication.    She brought in a lengthy ledger of her blood pressure readings and her blood pressure has been very well controlled over the past year.  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 ?She believes that she has a tendency to chronic/recurrent bladder infections.?Otherwise, over the past year, Joanne Flynn has been doing well and she reports no angina, congestive symptoms, palpitations, sensation of sustained forceful heart pounding, presyncope or syncope.?She reports?that her?exercise tolerance has improved over the past year.  She has been speed walking at [employer] for an hour 4 times a week. ?The patient reports no myalgias, bleeding abnormalities,?or strokelike symptoms.?Joanne Flynn?reports no history of myocardial infarction, congestive heart failure, renal insufficiency, diabetes mellitus, transient ischemic attack or stroke  ?  Historically, in May 2002 Joanne Flynn presented with chest discomfort and underwent coronary angiography on 12/04/2000. This revealed a 50 percent to 60 percent stenosis in the obtuse marginal branch of the left circumflex coronary artery which did not require intervention. 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 She underwent right iliac thrombectomy and patch angioplasty of the right common femoral artery. When I saw her in April of 2009 she described episodic tiredness and fatigue with certain activities such as mowing four acres. An exercise echo stress test was performed at that time which revealed no evidence for ischemia. ?  When I saw Joanne Flynn on 09/04/09 she reported a chronic hurting in her cervical neck area, upper back and right shoulder. It gradually improved with physical therapy. A regadenoson Sestamibi stress study was performed in April 2011 and was reported to be probably normal. It demonstrates a small area of partially fixed, partially reversible basal mid inferoseptal decreased tracer uptake. There is increased intensity extracardiac uptake noted which may limit the counts in the inferior segments.   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 She reports that was diagnosed with another basal cell carcinoma on her right thigh and received radiation therapy. She was seen in the Emergency room on 01/19/13 for circumferential head pressure and was noted to be hypertensive. She was given amlodipine 5 mg for a one time dose. 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 She has received steroid injections for A Dupuytren's contracture. ?I notice that she was seen in urgent care on 08/03/14 for head congestion, on 08/23/14 for bronchitis and on 09/30/14 for sinus drainage and fever. Joanne Flynn was seen in Urgent Care on 04/16/15 for hypertension. She reports that she had undergone evaluation for her abdominal discomfort but no specific diagnosis has been found. Her abdomen is  tender when she has the discomfort and it is possible that it is related to diverticulitis since she reports a known diagnosis of diverticulosis.?She had 2 episodes in the?winter of 2020-2021?where her blood pressure spiked and she sought evaluation and treatment in the emergency room. ?The first one?occurred on June 07, 2019 and the second 1 occurred approximately 2 weeks later. ?During her evaluation in the emergency room on June 07, 2019 her troponin was minimally elevated a stress test was performed the next day.?? The patient reports having COVID illness in November 2021 and in January 2022.         Vitals:    02/04/22 0943   BP: 132/82   BP Source: Arm, Left Upper   Pulse: 78   SpO2: 99%   O2 Device: None (Room air)   PainSc: Zero   Weight: 53.5 kg (118 lb)   Height: 162.6 cm (5' 4)     Body mass index is 20.25 kg/m?Marland Kitchen     Past Medical History  Patient Active Problem List    Diagnosis Date Noted   ? Coronary artery disease 03/14/2009     May 2002 presented with chest discomfort and underwent coronary angiography on 12/04/2000. This revealed a 50-60 percent stenosis in the obtuse marginal branch of the left circumflex coronary artery which did not require intervention. Post procedure complication with total occlusion of the right external iliac artery which required surgical repair.     ? Palpitations 03/14/2009   ? 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     ? History of tobacco use 03/14/2009         Review of Systems   Constitutional: Negative.   HENT: Negative.    Eyes: Negative.    Cardiovascular: Negative.    Respiratory: Negative.    Endocrine: Negative.    Hematologic/Lymphatic: Negative.    Skin: Negative.    Musculoskeletal: Negative.    Gastrointestinal: Negative.    Genitourinary: Negative.    Neurological: Negative.    Psychiatric/Behavioral: Negative.    Allergic/Immunologic: Negative.        Physical Exam  GENERAL: The patient is well developed, well nourished, resting comfortably and in no distress. ?  HEENT: No facial abnormalities noted.  NECK: There is no jugular venous distension. Carotids are palpable and without bruits. There is no thyroid enlargement. ?  Chest: Lung fields are clear to auscultation. There are no wheezes or crackles. ?  CV: There is a regular rhythm. The first and second heart sounds are normal. There are no murmurs, gallops or rubs. Her apical heart rate is?72?BPM. ?  ABD: The abdomen is soft and supple with normal bowel sounds. There is no hepatosplenomegaly, ascites, tenderness, masses or bruits. ?  Neuro: There are no focal motor defects. Ambulation is normal. Cognitive function appears normal. ?  Ext:?There is no edema or evidence of deep vein thrombosis. Peripheral pulses are satisfactory.  Still has some contracture of right hand.    SKIN:?There are no rashes and no cellulitis ?  PSYCH:?The patient is calm, rationale and oriented.?    Cardiovascular Studies  A twelve-lead ECG was obtained on 02/04/2022 and reveals normal sinus rhythm with a heart rate of 80 bpm.  There is no evidence of myocardial ischemia or infarction.  Recent labs obtained on 11/25/2021 reveals hemoglobin 14.6, white count 4.5, platelet count 204.  Her creatinine was 0.90 mg/dL and potassium 4.8 mmol/L.  Her total  cholesterol = 184, triglycerides = a 61, HDL = 91 and LDL = 81 mg/dL.    Echo Doppler study 09/16/2019:  Echocardiographic Findings  ?  Left Ventricle The left ventricular size is normal. Concentric remodeling. The left ventricular systolic function is normal. Normal left ventricular diastolic function.   Right Ventricle The right ventricular size is normal. The right ventricular wall thickness is normal. The pulmonary artery pressure could not be estimated due to inadequate tricuspid regurgitation signal.   Left Atrium Normal size.   Right Atrium Normal size.   IVC/SVC Normal central venous pressure (0-5 mm Hg).   Mitral Valve Normal valve structure. No stenosis. No regurgitation.   Tricuspid Valve Normal valve structure. No stenosis. Trace regurgitation.   Aortic Valve Tricuspid aortic valve present ?No stenosis. No regurgitation.   Pulmonary The pulmonic valve was not seen well but no Doppler evidence of stenosis. No regurgitation.   Aorta The aortic root and ascending aorta are normal in size.   Pericardium No pericardial effusion.   ?  ?  ?  Regadenoson thallium stress test June 08, 2019:  Scintigraphic (planar/tomographic):??There is normal homogenous uptake of thallium in all myocardial segments. ?There are no perfusion defects. ?All myocardial segments appear viable. Polar coordinate map identifies no perfusion abnormalities. ?  TID Ratio: ?0.96 ?(normal <1.36). Summed Stress Score: ?0, Summed Rest Score: ?0.?Regional Wall Thickening and Motion Post Stress: ??There is normal left ventricular wall motion and thickening of all myocardial segments. Left Ventricular Ejection Fraction (post stress, in the resting state) =??78 %. Left Ventricular End Diastolic Volume: 47 mL  SUMMARY/OPINION:??This study is normal with no evidence of significant myocardial ischemia. Left ventricular systolic function is normal. There are no high risk prognostic indicators present. ?The pharmacologic ECG portion of the study is negative for ischemia. Comparison is made with a prior regadenoson thallium stress study completed 11/01/2009. ?Ejection fraction was 63 %. ?There are ?no significant changes. In aggregate the current study is low risk in regards to predicted annual cardiovascular mortality rate.    Cardiovascular Health Factors  Vitals BP Readings from Last 3 Encounters:   02/04/22 132/82   02/05/21 (!) 148/82   01/31/20 138/88     Wt Readings from Last 3 Encounters:   02/04/22 53.5 kg (118 lb)   02/05/21 53.6 kg (118 lb 3.2 oz)   01/31/20 56.1 kg (123 lb 9.6 oz)     BMI Readings from Last 3 Encounters:   02/04/22 20.25 kg/m?   02/05/21 20.29 kg/m?   01/31/20 20.57 kg/m?      Smoking Social History     Tobacco Use   Smoking Status Former   Smokeless Tobacco Never   Tobacco Comments early 70's      Lipid Profile Cholesterol   Date Value Ref Range Status   11/25/2021 184  Final     HDL   Date Value Ref Range Status   11/25/2021 91  Final     LDL   Date Value Ref Range Status   11/25/2021 81  Final     Triglycerides   Date Value Ref Range Status   11/25/2021 61  Final      Blood Sugar No results found for: HGBA1C  Glucose   Date Value Ref Range Status   11/25/2021 100  Final   12/04/2020 99  Final   11/28/2019 94  Final          Problems Addressed Today  Encounter Diagnoses   Name Primary?   ?  Screening for heart disease Yes       Assessment and Plan     Ms. Bartek?appears stable from a cardiovascular perspective and reports no angina or congestive symptoms.  She continues to walk for exercise and her exercise tolerance has been very stable.  The patient reports that her blood pressure is very well controlled when her blood pressure is taken at home and averages 120/70 mmHg, sometimes lower.  She takes aspirin because she has known coronary disease.  She politely declines any type of cholesterol-lowering medication. I have asked the patient to keep a log book of her BP readings and to report BP readings exceeding 130/80 mm Hg.  I have asked her to return for follow-up in 1 years time. The total time spent during this interview and exam with preparation and chart review was 30 minutes.           Current Medications (including today's revisions)  ? aspirin EC 81 mg PO tablet Take one tablet by mouth daily.   ? Bifidobacterium infantis (ALIGN PO) Take  by mouth.   ? MULTIVITAMIN PO Take 1 tablet by mouth daily.   ? ramipriL (ALTACE) 2.5 mg capsule TAKE 1 CAPSULE EVERY DAY

## 2022-04-24 ENCOUNTER — Encounter: Admit: 2022-04-24 | Discharge: 2022-04-24 | Payer: MEDICARE | Primary: Family

## 2022-05-21 ENCOUNTER — Encounter: Admit: 2022-05-21 | Discharge: 2022-05-21 | Payer: MEDICARE | Primary: Family

## 2022-05-21 MED ORDER — RAMIPRIL 2.5 MG PO CAP
ORAL_CAPSULE | ORAL | 3 refills | Status: AC
Start: 2022-05-21 — End: ?

## 2022-06-05 ENCOUNTER — Encounter: Admit: 2022-06-05 | Discharge: 2022-06-05 | Payer: MEDICARE | Primary: Family

## 2022-07-20 ENCOUNTER — Encounter: Admit: 2022-07-20 | Discharge: 2022-07-20 | Payer: MEDICARE | Primary: Family

## 2022-08-05 ENCOUNTER — Encounter: Admit: 2022-08-05 | Discharge: 2022-08-05 | Payer: MEDICARE | Primary: Family

## 2022-11-25 ENCOUNTER — Encounter: Admit: 2022-11-25 | Discharge: 2022-11-25 | Payer: MEDICARE | Primary: Family

## 2023-03-09 ENCOUNTER — Encounter: Admit: 2023-03-09 | Discharge: 2023-03-09 | Payer: MEDICARE

## 2023-03-10 ENCOUNTER — Encounter: Admit: 2023-03-10 | Discharge: 2023-03-10 | Payer: MEDICARE

## 2023-03-12 ENCOUNTER — Encounter: Admit: 2023-03-12 | Discharge: 2023-03-12 | Payer: MEDICARE

## 2023-03-12 DIAGNOSIS — I745 Embolism and thrombosis of iliac artery: Secondary | ICD-10-CM

## 2023-03-12 DIAGNOSIS — R002 Palpitations: Secondary | ICD-10-CM

## 2023-03-12 DIAGNOSIS — Z136 Encounter for screening for cardiovascular disorders: Secondary | ICD-10-CM

## 2023-03-12 DIAGNOSIS — I2510 Atherosclerotic heart disease of native coronary artery without angina pectoris: Secondary | ICD-10-CM

## 2023-03-12 NOTE — Progress Notes
Date of Service: 03/12/2023    Joanne Flynn is a 72 y.o. female.       HPI   Joanne Flynn is followed for coronary artery disease and labile hypertension. She is still having some difficulty with mobility in her right hand following hand surgery in 2023.  She continues to take low-dose ramapril daily and reports no adverse effects to this medication.    She brought in a lengthy ledger of her blood pressure readings and her blood pressure has been very well controlled over the past year.  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  She believes that she has a tendency to chronic/recurrent bladder infections.  No hospitalizations or emergency room visits are reported over the past year.  Otherwise, over the past year, Joanne Flynn has been doing well and she reports no angina, congestive symptoms, palpitations, sensation of sustained forceful heart pounding, presyncope or syncope. She reports that her exercise tolerance has improved over the past year.  She has been speed walking at [facility] for an hour 4 times a week.  The patient reports no myalgias, bleeding abnormalities, or strokelike symptoms. Joanne Flynn reports no history of myocardial infarction, congestive heart failure, renal insufficiency, diabetes mellitus, transient ischemic attack or stroke     Historically, in May 2002 Joanne Flynn presented with chest discomfort and underwent coronary angiography on 12/04/2000. This revealed a 50 percent to 60 percent stenosis in the obtuse marginal branch of the left circumflex coronary artery which did not require intervention. 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 She underwent right iliac thrombectomy and patch angioplasty of the right common femoral artery. When I saw her in April of 2009 she described episodic tiredness and fatigue with certain activities such as mowing four acres. An exercise echo stress test was performed at that time which revealed no evidence for ischemia.    When I saw Joanne Flynn on 09/04/09 she reported a chronic hurting in her cervical neck area, upper back and right shoulder. It gradually improved with physical therapy. A regadenoson Sestamibi stress study was performed in April 2011 and was reported to be probably normal. It demonstrates a small area of partially fixed, partially reversible basal mid inferoseptal decreased tracer uptake. There is increased intensity extracardiac uptake noted which may limit the counts in the inferior segments.   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 She reports that was diagnosed with another basal cell carcinoma on her right thigh and received radiation therapy. She was seen in the Emergency room on 01/19/13 for circumferential head pressure and was noted to be hypertensive. She was given amlodipine 5 mg for a one time dose. 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 She has received steroid injections for A Dupuytren's contracture.  I notice that she was seen in urgent care on 08/03/14 for head congestion, on 08/23/14 for bronchitis and on 09/30/14 for sinus drainage and fever. Joanne Flynn was seen in Urgent Care on 04/16/15 for hypertension. She reports that she had undergone evaluation for her abdominal discomfort but no specific diagnosis has been found. Her abdomen is tender when she has the discomfort and it is possible that it is related to diverticulitis since she  reports a known diagnosis of diverticulosis. She had 2 episodes in the winter of 2020-2021 where her blood pressure spiked and she sought evaluation and treatment in the emergency room.  The first one occurred on June 07, 2019 and the second 1 occurred approximately 2 weeks later.  During her evaluation in the emergency room on June 07, 2019 her troponin was minimally elevated a stress test was performed the next day.   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       Vitals:    03/12/23 1250   BP: 129/81   BP Source: Arm, Left Upper   Pulse: 65   SpO2: 99%   O2 Device: None (Room air)   PainSc: Zero   Weight: 53.5 kg (118 lb)   Height: 163.8 cm (5' 4.5)     Body mass index is 19.94 kg/m?Marland Kitchen     Past Medical History  Patient Active Problem List    Diagnosis Date Noted    Coronary artery disease 03/14/2009     May 2002 presented with chest discomfort and underwent coronary angiography on 12/04/2000. This revealed a 50-60 percent stenosis in the obtuse marginal branch of the left circumflex coronary artery which did not require intervention. Post procedure complication with total occlusion of the right external iliac artery which required surgical repair.      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      History of tobacco use 03/14/2009         Review of Systems   Constitutional: Negative.   HENT:  Positive for tinnitus.    Eyes: Negative.    Cardiovascular: Negative.    Respiratory: Negative.     Endocrine: Negative.    Hematologic/Lymphatic: Negative.    Skin: Negative.    Musculoskeletal:  Positive for back pain.   Gastrointestinal: Negative.    Genitourinary: Negative.    Neurological:  Positive for numbness and paresthesias.   Psychiatric/Behavioral: Negative.     Allergic/Immunologic: Negative.        Physical Exam  GENERAL: The patient is well developed, well nourished, resting comfortably and in no distress.    HEENT: No facial abnormalities noted.  NECK: There is no jugular venous distension. Carotids are palpable and without bruits. There is no thyroid enlargement.    Chest: Lung fields are clear to auscultation. There are no wheezes or crackles.    CV: There is a regular rhythm. The first and second heart sounds are normal. There are no murmurs, gallops or rubs. Her apical heart rate is 60 BPM.    ABD: The abdomen is soft and supple with normal bowel sounds. There is no hepatosplenomegaly, ascites, tenderness, masses or bruits.    Neuro: There are no focal motor defects. Ambulation is normal. Cognitive function appears normal.    Ext: There is no edema or evidence of deep vein thrombosis. Peripheral pulses are satisfactory.  Still has considerable contracture of right hand.    SKIN: There are no rashes and no cellulitis    PSYCH: The patient is calm, rationale and oriented.     Cardiovascular Studies  A twelve-lead ECG was obtained on 03/12/2023 reveals sinus bradycardia with a heart rate of 57 bpm.  There is no evidence of myocardial ischemia or infarction.    Echo Doppler  study 09/16/2019:  Echocardiographic Findings     Left Ventricle The left ventricular size is normal. Concentric remodeling. The left ventricular systolic function is normal. Normal left ventricular diastolic function.   Right Ventricle The right ventricular size is normal. The right ventricular wall thickness is normal. The pulmonary artery pressure could not be estimated due to inadequate tricuspid regurgitation signal.   Left Atrium Normal size.   Right Atrium Normal size.   IVC/SVC Normal central venous pressure (0-5 mm Hg).   Mitral Valve Normal valve structure. No stenosis. No regurgitation.   Tricuspid Valve Normal valve structure. No stenosis. Trace regurgitation.   Aortic Valve Tricuspid aortic valve present  No stenosis. No regurgitation.   Pulmonary The pulmonic valve was not seen well but no Doppler evidence of stenosis. No regurgitation.   Aorta The aortic root and ascending aorta are normal in size.   Pericardium No pericardial effusion. Regadenoson thallium stress test June 08, 2019:  Scintigraphic (planar/tomographic):  There is normal homogenous uptake of thallium in all myocardial segments.  There are no perfusion defects.  All myocardial segments appear viable. Polar coordinate map identifies no perfusion abnormalities.    TID Ratio:  0.96  (normal <1.36). Summed Stress Score:  0, Summed Rest Score:  0. Regional Wall Thickening and Motion Post Stress:   There is normal left ventricular wall motion and thickening of all myocardial segments. Left Ventricular Ejection Fraction (post stress, in the resting state) =  78 %. Left Ventricular End Diastolic Volume: 47 mL  SUMMARY/OPINION:  This study is normal with no evidence of significant myocardial ischemia. Left ventricular systolic function is normal. There are no high risk prognostic indicators present.  The pharmacologic ECG portion of the study is negative for ischemia. Comparison is made with a prior regadenoson thallium stress study completed 11/01/2009.  Ejection fraction was 63 %.  There are  no significant changes. In aggregate the current study is low risk in regards to predicted annual cardiovascular mortality rate.    Cardiovascular Health Factors  Vitals BP Readings from Last 3 Encounters:   03/12/23 129/81   02/04/22 132/82   02/05/21 (!) 148/82     Wt Readings from Last 3 Encounters:   03/12/23 53.5 kg (118 lb)   02/04/22 53.5 kg (118 lb)   02/05/21 53.6 kg (118 lb 3.2 oz)     BMI Readings from Last 3 Encounters:   03/12/23 19.94 kg/m?   02/04/22 20.25 kg/m?   02/05/21 20.29 kg/m?      Smoking Social History     Tobacco Use   Smoking Status Former   Smokeless Tobacco Never   Tobacco Comments    early 70's      Lipid Profile Cholesterol   Date Value Ref Range Status   12/13/2022 195  Final     HDL   Date Value Ref Range Status   12/13/2022 80  Final     LDL   Date Value Ref Range Status   12/13/2022 100  Final     Triglycerides   Date Value Ref Range Status   12/13/2022 76  Final Blood Sugar No results found for: HGBA1C  Glucose   Date Value Ref Range Status   11/27/2022 95  Final   11/25/2021 100  Final   12/04/2020 99  Final          Problems Addressed Today  Encounter Diagnoses   Name Primary?    Screening for heart disease Yes    Iliac artery  occlusion, right [facility])        Assessment and Plan   Ms. Sachse appears stable from a cardiovascular perspective and reports no angina or congestive symptoms.  Cardiovascular risk factor modification was discussed in great detail.  She continues to walk for exercise and her exercise tolerance has been very stable.  The patient reports that her blood pressure is very well controlled when her blood pressure is taken at home and averages 120/70 mmHg, sometimes lower.  She wants to continue low-dose aspirin because she has known coronary disease.  She politely declines any type of cholesterol-lowering medication. I have asked the patient to keep a log book of her BP readings and to report BP readings exceeding 130/80 mm Hg.  I have asked Ms. Chow to let me know if she wanted to be referred to [facility] for further consideration of the contractures in her right hand.  At the present time she politely declined referral.  I have asked her to return for follow-up in 1 years time. The total time spent during this interview and exam with preparation and chart review was 30 minutes.            Current Medications (including today's revisions)   aspirin EC 81 mg PO tablet Take one tablet by mouth daily.    Bifidobacterium infantis (ALIGN PO) Take  by mouth daily.    MULTIVITAMIN PO Take 1 tablet by mouth daily.    ramipriL (ALTACE) 2.5 mg capsule TAKE 1 CAPSULE EVERY DAY

## 2023-03-12 NOTE — Patient Instructions
Thank you for visiting our office today.    We would like to make the following medication adjustments:  NONE       Otherwise continue the same medications as you have been doing.          We will be pursuing the following tests after your appointment today:       Orders Placed This Encounter    ECG 12-LEAD         We will plan to see you back in 12 months.  Please call us in the meantime with any questions or concerns.        Please allow 5-7 business days for our providers to review your results. All normal results will go to MyChart. If you do not have Mychart, it is strongly recommended to get this so you can easily view all your results. If you do not have mychart, we will attempt to call you once with normal lab and testing results. If we cannot reach you by phone with normal results, we will send you a letter.  If you have not heard the results of your testing after one week please give us a call.       Your Cardiovascular Medicine [place] Team (Steve, Lisa, Jamie, Melanie, and Devaunte Gasparini)  phone number is [phone].

## 2023-07-06 ENCOUNTER — Encounter: Admit: 2023-07-06 | Discharge: 2023-07-06 | Payer: MEDICARE

## 2023-07-06 MED ORDER — RAMIPRIL 2.5 MG PO CAP
ORAL_CAPSULE | ORAL | 3 refills | Status: AC
Start: 2023-07-06 — End: ?

## 2023-08-19 ENCOUNTER — Encounter: Admit: 2023-08-19 | Discharge: 2023-08-19 | Payer: MEDICARE

## 2023-12-29 IMAGING — CR [ID]
3 series · 3 of 3 positions shown · non-contrast
Comparison: none

[w knee ap right]
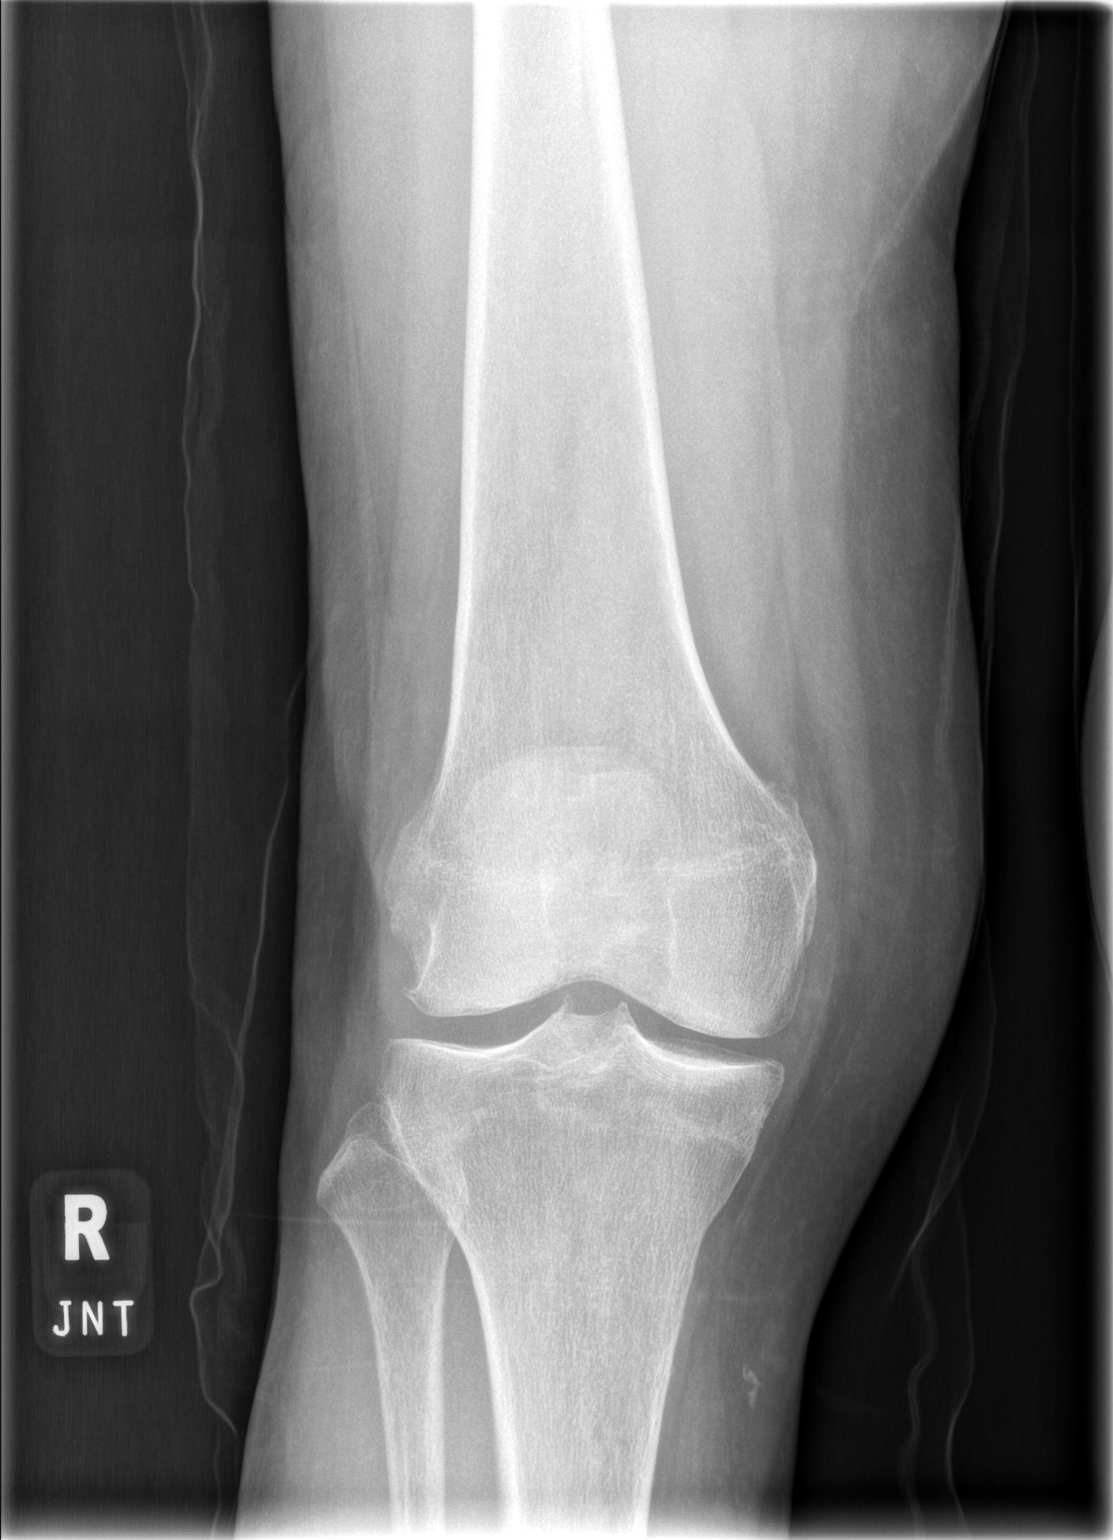

[x knee lat right]
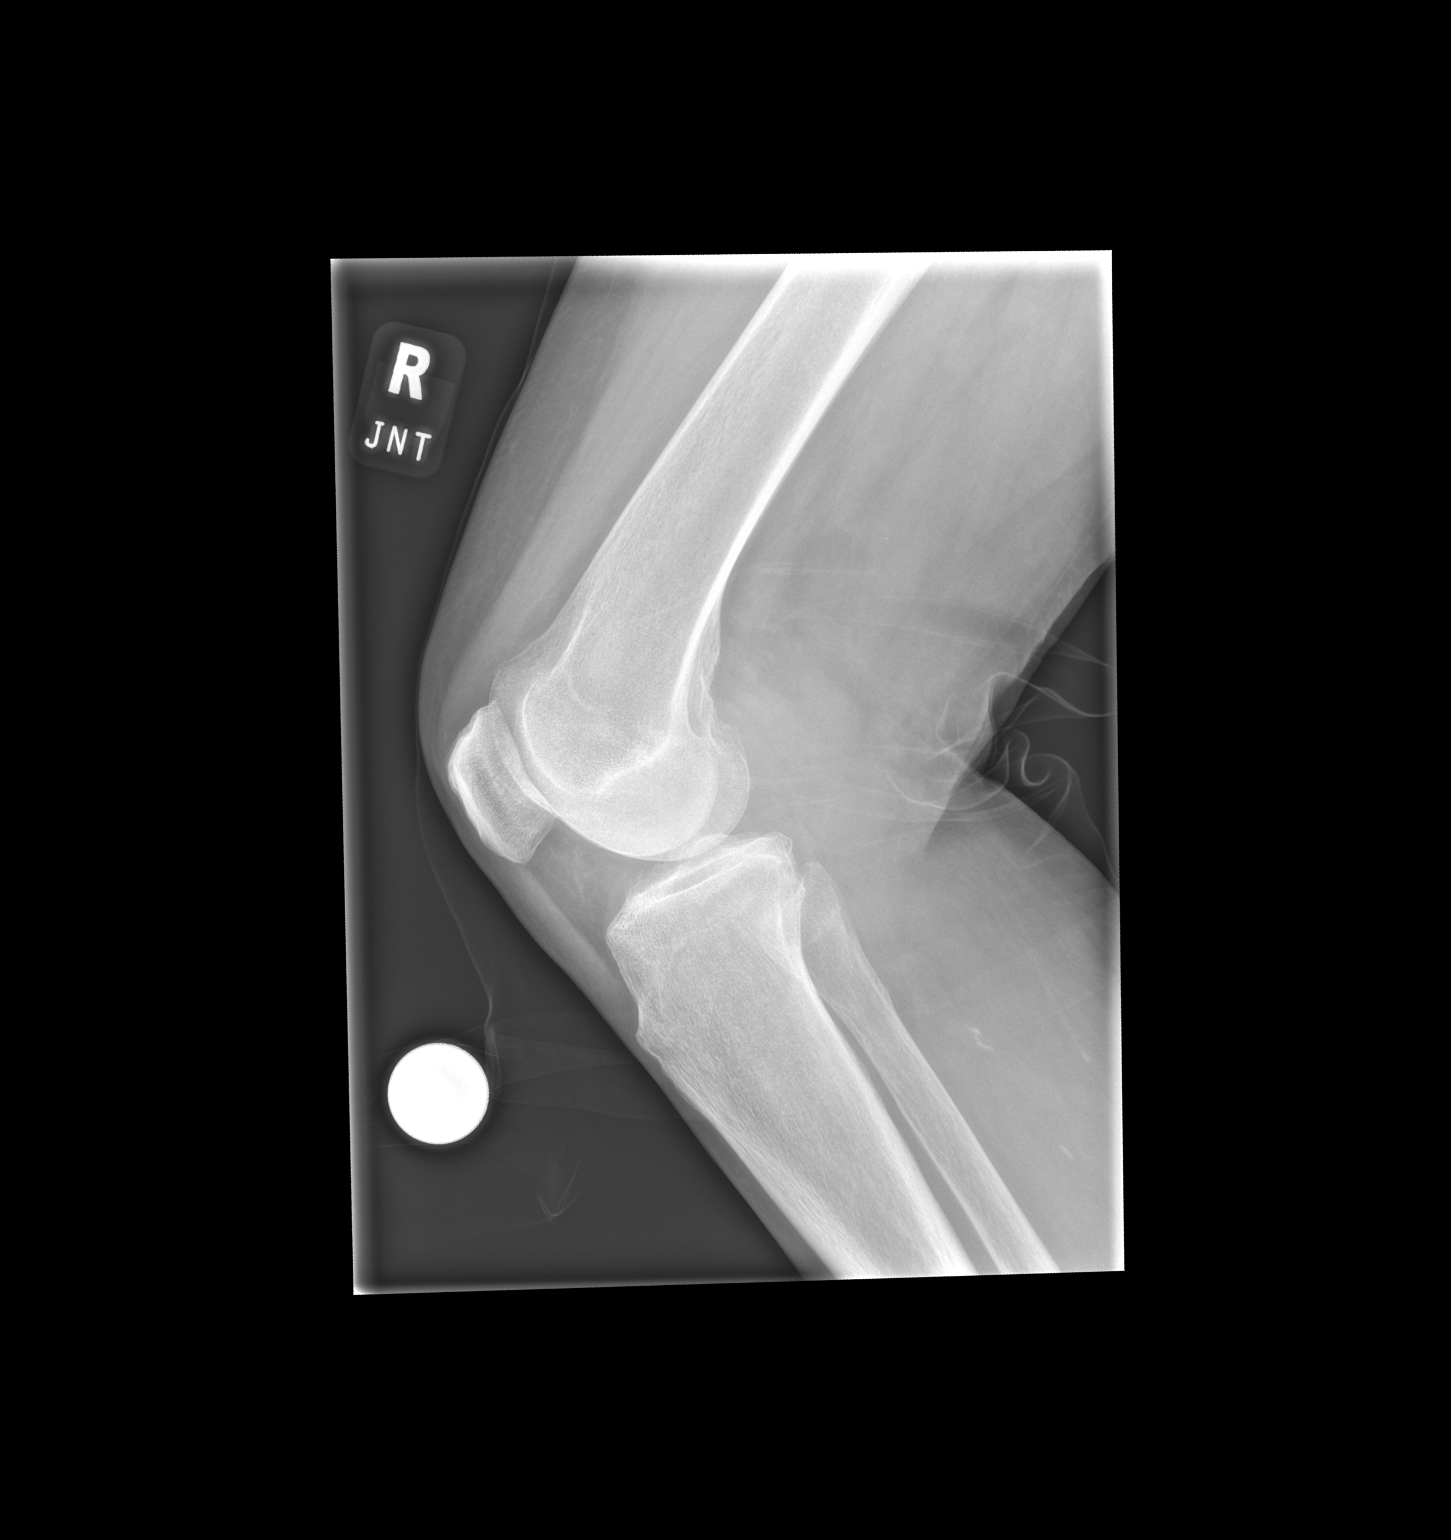

[x knee sunrise right]
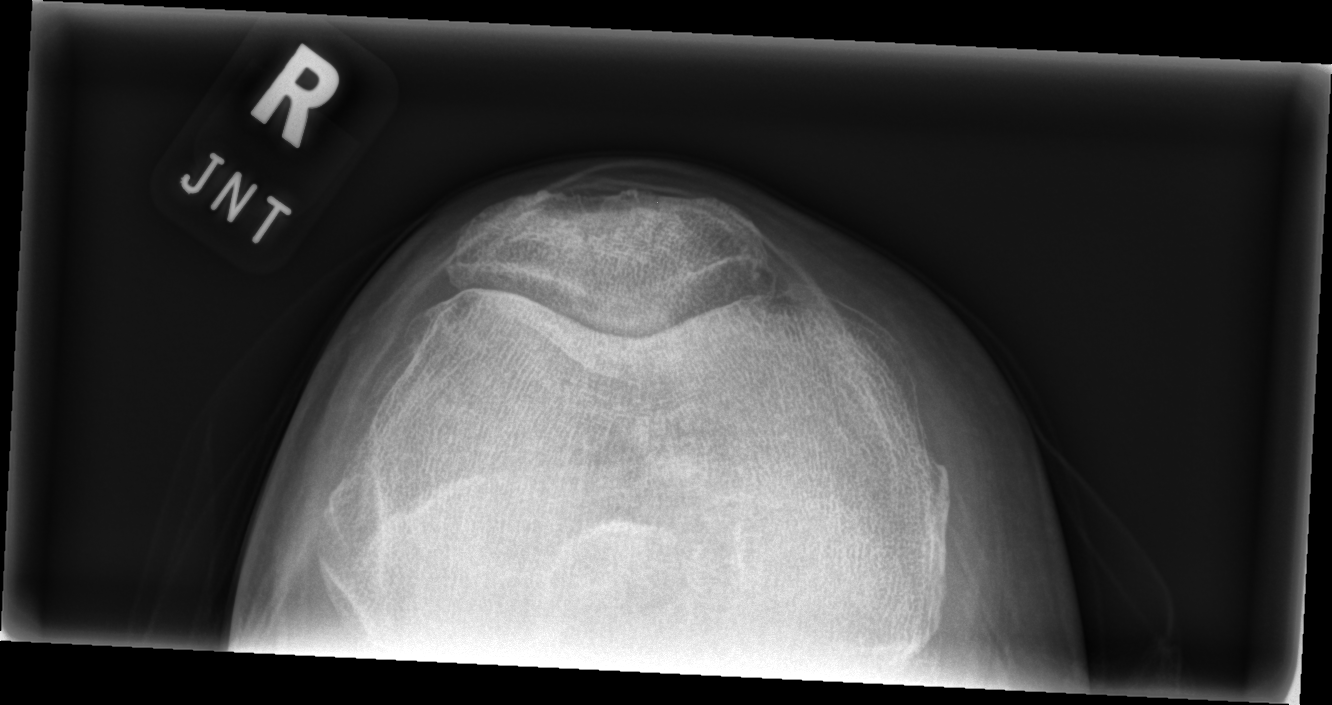

[3 of 3 positions shown; findings below may reference images not displayed]

DIAGNOSTIC STUDIES

EXAM

XR knee RT 3V

INDICATION

Right knee pain
Pt c/o right knee pain x 4 days. Pt states lateral side of knee hurts when bending knee, NKI. JT/TJ

TECHNIQUE

AP lateral patellar views

COMPARISONS

None available

FINDINGS

Slight spurring of the lateral compartment and tibial spines is evident. There is mild spurring off
the articular facets of the patella. No fractures are seen.

IMPRESSION

Mild degenerative changes. No fractures are seen.

Tech Notes:

Pt c/o right knee pain x 4 days. Pt states lateral side of knee hurts when bending knee, NKI. JT/TJ

## 2024-01-18 ENCOUNTER — Encounter: Admit: 2024-01-18 | Discharge: 2024-01-18 | Payer: MEDICARE

## 2024-01-21 ENCOUNTER — Ambulatory Visit: Admit: 2024-01-21 | Discharge: 2024-01-21 | Payer: MEDICARE

## 2024-01-21 ENCOUNTER — Encounter: Admit: 2024-01-21 | Discharge: 2024-01-21 | Payer: MEDICARE

## 2024-01-21 DIAGNOSIS — Z136 Encounter for screening for cardiovascular disorders: Principal | ICD-10-CM

## 2024-01-21 DIAGNOSIS — I745 Embolism and thrombosis of iliac artery: Secondary | ICD-10-CM

## 2024-01-21 NOTE — Patient Instructions
 Thank you for visiting our office today.    We would like to make the following medication adjustments:      Stop Aspirin       Otherwise continue the same medications as you have been doing.          We will be pursuing the following tests after your appointment today:       Orders Placed This Encounter    ECG 12-LEAD         We will plan to see you back in 12 months.  Please call us  in the meantime with any questions or concerns.        Please allow 5-7 business days for our providers to review your results. All normal results will go to MyChart. If you do not have Mychart, it is strongly recommended to get this so you can easily view all your results. If you do not have mychart, we will attempt to call you once with normal lab and testing results. If we cannot reach you by phone with normal results, we will send you a letter.  If you have not heard the results of your testing after one week please give us  a call.       Your Cardiovascular Medicine [place] Team [place], [place], [place], and [place])  phone number is [phone].

## 2024-01-21 NOTE — Progress Notes
 Date of Service: 01/21/2024    Joanne Flynn is a 73 y.o. female.       HPI   Joanne Flynn is followed for coronary artery disease and labile hypertension.  She continues to take low-dose ramipril  daily and reports no adverse effects to this medication.    She brought in a lengthy ledger of her blood pressure readings and her blood pressure has been very well controlled over the past year.  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  She believes that she has a tendency to chronic/recurrent bladder infections and currently is taking a cephalosporin. She is still having some difficulty with mobility in her right hand following hand surgery in 2023.  No hospitalizations or emergency room visits are reported over the past year.  Otherwise, over the past year, Joanne Flynn has been doing well and she reports no angina, congestive symptoms, palpitations, sensation of sustained forceful heart pounding, presyncope or syncope. She reports that her exercise tolerance has improved over the past year.  She continues walking at [facility] for an hour 4 times a week.  The patient reports no myalgias, bleeding abnormalities, or strokelike symptoms. Joanne Flynn reports no history of myocardial infarction, congestive heart failure, renal insufficiency, diabetes mellitus, transient ischemic attack or stroke     Historically, in May 2002 Joanne Flynn presented with chest discomfort and underwent coronary angiography on 12/04/2000. This revealed a 50 percent to 60 percent stenosis in the obtuse marginal branch of the left circumflex coronary artery which did not require intervention. 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 She underwent right iliac thrombectomy and patch angioplasty of the right common femoral artery. When I saw her in April of 2009 she described episodic tiredness and fatigue with certain activities such as mowing four acres. An exercise echo stress test was performed at that time which revealed no evidence for ischemia.    When I saw Joanne Flynn on 09/04/09 she reported a chronic hurting in her cervical neck area, upper back and right shoulder. It gradually improved with physical therapy. A regadenoson Sestamibi stress study was performed in April 2011 and was reported to be probably normal. It demonstrates a small area of partially fixed, partially reversible basal mid inferoseptal decreased tracer uptake. There is increased intensity extracardiac uptake noted which may limit the counts in the inferior segments.   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 She reports that was diagnosed with another basal cell carcinoma on her right thigh and received radiation therapy. She was seen in the Emergency room on 01/19/13 for circumferential head pressure and was noted to be hypertensive. She was given amlodipine  5 mg for a one time dose. 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 She has received steroid injections for A Dupuytren's contracture.  I notice that she was seen in urgent care on 08/03/14 for head congestion, on 08/23/14 for bronchitis and on 09/30/14 for sinus drainage and fever. Joanne Flynn was seen in Urgent Care on 04/16/15 for hypertension. She reports that she had undergone evaluation for her abdominal discomfort but no specific diagnosis has been found. Her abdomen is tender when she has the discomfort and it is possible that it is related  to diverticulitis since she reports a known diagnosis of diverticulosis. She had 2 episodes in the winter of 2020-2021 where her blood pressure spiked and she sought evaluation and treatment in the emergency room.  The first one occurred on June 07, 2019 and the second 1 occurred approximately 2 weeks later.  During her evaluation in the emergency room on June 07, 2019 her troponin was minimally elevated a stress test was performed the next day.   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       Vitals:    01/21/24 1252   BP: 130/78   BP Source: Arm, Left Upper   Pulse: 75   SpO2: 96%   O2 Device: None (Room air)   PainSc: Zero   Weight: 52.9 kg (116 lb 9.6 oz)   Height: 163.8 cm (5' 4.5)     Body mass index is 19.71 kg/m?SABRA     Past Medical History  Patient Active Problem List    Diagnosis Date Noted    Coronary artery disease 03/14/2009     May 2002 presented with chest discomfort and underwent coronary angiography on 12/04/2000. This revealed a 50-60 percent stenosis in the obtuse marginal branch of the left circumflex coronary artery which did not require intervention. Post procedure complication with total occlusion of the right external iliac artery which required surgical repair.      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      History of tobacco use 03/14/2009         Review of Systems   Constitutional: Negative.   HENT: Negative.     Eyes: Negative.    Cardiovascular: Negative.    Respiratory: Negative.     Endocrine: Negative.    Hematologic/Lymphatic: Negative.    Skin: Negative.    Musculoskeletal:  Positive for back pain.   Gastrointestinal: Negative.    Genitourinary: Negative.    Neurological: Negative.    Psychiatric/Behavioral: Negative.     Allergic/Immunologic: Negative.        Physical Exam  GENERAL: The patient is well developed, well nourished, resting comfortably and in no distress.    HEENT: No facial abnormalities noted.  NECK: There is no jugular venous distension. Carotids are palpable and without bruits. There is no thyroid enlargement.    Chest: Lung fields are clear to auscultation. There are no wheezes or crackles.    CV: There is a regular rhythm. The first and second heart sounds are normal. There are no murmurs, gallops or rubs. Her apical heart rate is 64 BPM.    ABD: The abdomen is soft and supple with normal bowel sounds. There is no hepatosplenomegaly, ascites, tenderness, masses or bruits.    Neuro: There are no focal motor defects. Ambulation is normal. Cognitive function appears normal.    Ext: There is no edema or evidence of deep vein thrombosis. Peripheral pulses are satisfactory.  Still has considerable contracture of right hand.    SKIN: There are no rashes and no cellulitis    PSYCH: The patient is calm, rationale and oriented.     Cardiovascular Studies  A twelve-lead ECG obtained on 01/21/2024 reveals normal sinus rhythm with a heart rate of 64 bpm.  There is no evidence of myocardial ischemia or infarction.  Labs from 12/02/2023 revealed  hemoglobin = 13.8, potassium = 4.7 mmol/L, creatinine = 0.92, ALT = 20, total cholesterol = 180, triglycerides = 47, HDL = 84 and LDL cholesterol = 87 mg/dL.  Her TSH = 1.71 microunits/mL.  Echo Doppler study 09/16/2019:  Echocardiographic Findings     Left Ventricle The left ventricular size is normal. Concentric remodeling. The left ventricular systolic function is normal. Normal left ventricular diastolic function.   Right Ventricle The right ventricular size is normal. The right ventricular wall thickness is normal. The pulmonary artery pressure could not be estimated due to inadequate tricuspid regurgitation signal.   Left Atrium Normal size.   Right Atrium Normal size.   IVC/SVC Normal central venous pressure (0-5 mm Hg).   Mitral Valve Normal valve structure. No stenosis. No regurgitation.   Tricuspid Valve Normal valve structure. No stenosis. Trace regurgitation.   Aortic Valve Tricuspid aortic valve present  No stenosis. No regurgitation. Pulmonary The pulmonic valve was not seen well but no Doppler evidence of stenosis. No regurgitation.   Aorta The aortic root and ascending aorta are normal in size.   Pericardium No pericardial effusion.            Regadenoson thallium stress test June 08, 2019:  Scintigraphic (planar/tomographic):  There is normal homogenous uptake of thallium in all myocardial segments.  There are no perfusion defects.  All myocardial segments appear viable. Polar coordinate map identifies no perfusion abnormalities.    TID Ratio:  0.96  (normal <1.36). Summed Stress Score:  0, Summed Rest Score:  0. Regional Wall Thickening and Motion Post Stress:   There is normal left ventricular wall motion and thickening of all myocardial segments. Left Ventricular Ejection Fraction (post stress, in the resting state) =  78 %. Left Ventricular End Diastolic Volume: 47 mL  SUMMARY/OPINION:  This study is normal with no evidence of significant myocardial ischemia. Left ventricular systolic function is normal. There are no high risk prognostic indicators present.  The pharmacologic ECG portion of the study is negative for ischemia. Comparison is made with a prior regadenoson thallium stress study completed 11/01/2009.  Ejection fraction was 63 %.  There are  no significant changes. In aggregate the current study is low risk in regards to predicted annual cardiovascular mortality rate.  Cardiovascular Health Factors  Vitals BP Readings from Last 3 Encounters:   01/21/24 130/78   03/12/23 129/81   02/04/22 132/82     Wt Readings from Last 3 Encounters:   01/21/24 52.9 kg (116 lb 9.6 oz)   03/12/23 53.5 kg (118 lb)   02/04/22 53.5 kg (118 lb)     BMI Readings from Last 3 Encounters:   01/21/24 19.71 kg/m?   03/12/23 19.94 kg/m?   02/04/22 20.25 kg/m?      Smoking Social History     Tobacco Use   Smoking Status Former   Smokeless Tobacco Never   Tobacco Comments    early 70's      Lipid Profile Cholesterol   Date Value Ref Range Status 12/02/2023 180  Final     HDL   Date Value Ref Range Status   12/02/2023 84  Final     LDL   Date Value Ref Range Status   12/02/2023 87  Final     Triglycerides   Date Value Ref Range Status   12/02/2023 47  Final      Blood Sugar No results found for: HGBA1C  Glucose   Date Value Ref Range Status   12/02/2023  103  Final   11/27/2022 95  Final   11/25/2021 100  Final          Problems Addressed Today  Encounter Diagnoses   Name Primary?    Screening for heart disease Yes       Assessment and Plan   Ms. Scism appears stable from a cardiovascular perspective and reports no angina or congestive symptoms.  Cardiovascular risk factor management was discussed in great detail.  She continues to walk for exercise and her exercise tolerance has been very stable.  The patient reports that her blood pressure is very well controlled when her blood pressure is taken at home and averages 120/70 mmHg, sometimes lower.  She wants to continue low-dose aspirin because she has known coronary disease.  She politely declines any type of cholesterol-lowering medication. I have asked the patient to keep a log book of her BP readings and to report BP readings exceeding 130/80 mm Hg.  She was informed that she does not need to take aspirin for any cardiovascular reasons.  I have asked her to return for follow-up in 1 years time. The total time spent during this interview and exam with preparation and chart review was 30 minutes.          Current Medications (including today's revisions)   aspirin EC 81 mg PO tablet Take one tablet by mouth daily.    Bifidobacterium infantis (ALIGN PO) Take  by mouth daily.    cefdinir (OMNICEF) 300 mg capsule Take one capsule by mouth twice daily.    MULTIVITAMIN PO Take 1 tablet by mouth daily.    pantoprazole DR (PROTONIX) 20 mg tablet Take one tablet by mouth daily.    ramipriL  (ALTACE ) 2.5 mg capsule TAKE 1 CAPSULE EVERY DAY

## 2024-06-27 ENCOUNTER — Encounter
Admit: 2024-06-27 | Discharge: 2024-06-27 | Payer: MEDICARE | Primary: Student in an Organized Health Care Education/Training Program

## 2024-06-27 MED ORDER — RAMIPRIL 2.5 MG PO CAP
2.5 mg | ORAL_CAPSULE | Freq: Two times a day (BID) | ORAL | 3 refills | Status: AC
Start: 2024-06-27 — End: ?

## 2024-06-27 NOTE — Telephone Encounter [36]
 Patient called nursing line to report that she just got home from the ER this morning after having high blood pressures the last 2 days. She states that yesterday morning she woke up with her left shoulder throbbing. She denied any other associated symptoms with this including chest pain, back pain, jaw pain, shortness of breath, palpitations, etc. She states the pain did not radiate anywhere. She said when she checked her blood pressure it was 180/84. She states that she took a baby aspirin and within an hour the shoulder throbbing stopped. She states by midday her blood pressure improved to 140/85.     This morning her blood pressure was 180/85. Again, she states that she experienced no other symptoms with this higher blood pressure, but she was concerned so she reported to the ER. Labs, chest x-ray, EKG were all unremarkable other than the UA revealed leukocytes and bacteria. They sent off for culture to rule out UTI. She states by the time she left the ER her blood pressure again had improved to 140s systolic.       Patient denies any symptoms of a UTI. She said she just had one a few weeks ago that completely resolved. She states that her blood pressures are usually 120s/70s. She states that she has been taking her Ramipril  2.5mg  daily as prescribed. Patient takes her blood pressures daily and records all of her readings.       Will route to [facility] for review and recommendations.

## 2024-06-27 NOTE — Telephone Encounter [36]
 Debroah Elspeth NOVAK, MD to Me  (Selected Message)  06/27/24  2:19 PM  Larraine: My recollection is that she is very sensitive to medications which is why she is on a low-dose of ramipril .  She could certainly increase her ramipril  to 2.5 mg twice a day and we can titrate her antihypertensive medications from there.  Thanks.  SBG        Recommendations called to patient. Patient verbalized understanding and has no further questions or concerns at this time.

## 2024-06-28 ENCOUNTER — Encounter
Admit: 2024-06-28 | Discharge: 2024-06-28 | Payer: MEDICARE | Primary: Student in an Organized Health Care Education/Training Program

## 2024-06-28 VITALS — BP 145/90 | HR 68 | Ht 64.5 in | Wt 116.0 lb

## 2024-06-28 DIAGNOSIS — R002 Palpitations: Secondary | ICD-10-CM

## 2024-06-28 DIAGNOSIS — I2510 Atherosclerotic heart disease of native coronary artery without angina pectoris: Principal | ICD-10-CM

## 2024-06-28 MED ORDER — AMLODIPINE 2.5 MG PO TAB
2.5 mg | ORAL_TABLET | Freq: Every day | ORAL | 1 refills | 90.00000 days | Status: AC | PRN
Start: 2024-06-28 — End: ?

## 2024-06-28 NOTE — Progress Notes [1]
 Date of Service: 06/28/2024    Joanne Flynn is a 73 y.o. female.       HPI   Joanne Flynn is followed for coronary artery disease and labile hypertension.  She reports that her blood pressure readings have been labile over the past month.  Some blood pressure readings are well-controlled but occasionally her systolic blood pressure reading is high, exceeding 140 mmHg.  I believe that she may worry about blood pressure control which can even pose some stress for the patient, perhaps increasing her blood pressure further.  On 06/26/2024 she reports having some left shoulder discomfort that lasted for several hours but this discomfort seems musculoskeletal, increasing with movement and tender to touch.  She also reports occasional neuropathic discomfort in her left arm.  She went to the local emergency room on 06/27/2024 but there was no evidence for an acute coronary syndrome.  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  However, she continues to walk briskly for an hour 4 times a week without any difficulty (chest discomfort, dyspnea or palpitations) whatsoever.  She believes that she has a tendency to chronic/recurrent bladder infections and currently is taking a cephalosporin.   Otherwise, over the past year, Joanne Flynn has been doing well and she reports no angina, congestive symptoms, palpitations, sensation of sustained forceful heart pounding, presyncope or syncope.  Her exercise tolerance has been stable.  The patient reports no myalgias, bleeding abnormalities, or strokelike symptoms. Joanne Flynn reports no history of myocardial infarction, congestive heart failure, renal insufficiency, diabetes mellitus, transient ischemic attack or stroke     Historically, in May 2002 Joanne Flynn presented with chest discomfort and underwent coronary angiography on 12/04/2000. This revealed a 50 percent to 60 percent stenosis in the obtuse marginal branch of the left circumflex coronary artery which did not require intervention. 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 She underwent right iliac thrombectomy and patch angioplasty of the right common femoral artery. When I saw her in April of 2009 she described episodic tiredness and fatigue with certain activities such as mowing four acres. An exercise echo stress test was performed at that time which revealed no evidence for ischemia.    When I saw Joanne Flynn on 09/04/09 she reported a chronic hurting in her cervical neck area, upper back and right shoulder. It gradually improved with physical therapy. A regadenoson Sestamibi stress study was performed in April 2011 and was reported to be probably normal. It demonstrates a small area of partially fixed, partially reversible basal mid inferoseptal decreased tracer uptake. There is increased intensity extracardiac uptake noted which may limit the counts in the inferior segments.   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 She reports that was diagnosed with another basal cell carcinoma on her right thigh and received radiation therapy. She was seen in the Emergency room on 01/19/13 for circumferential head pressure and was noted to be hypertensive. She was given amlodipine  5 mg for a one time dose. 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 She has received steroid injections for A Dupuytren's contracture.  I notice that she was seen in urgent care on 08/03/14 for head congestion, on 08/23/14 for bronchitis and on 09/30/14 for  sinus drainage and fever. Joanne Flynn was seen in Urgent Care on 04/16/15 for hypertension. She reports that she had undergone evaluation for her abdominal discomfort but no specific diagnosis has been found. Her abdomen is tender when she has the discomfort and it is possible that it is related to diverticulitis since she reports a known diagnosis of diverticulosis. She had 2 episodes in the winter of 2020-2021 where her blood pressure spiked and she sought evaluation and treatment in the emergency room.  The first one occurred on June 07, 2019 and the second 1 occurred approximately 2 weeks later.  During her evaluation in the emergency room on June 07, 2019 her troponin was minimally elevated a stress test was performed the next day.   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       Objective   Vitals:    06/28/24 1249   BP: (!) 145/90  Comment: patient's cuff 159/78 pulse 72.   BP Source: Arm, Left Upper   Pulse: 68   SpO2: 99%   O2 Device: None (Room air)   PainSc: Five   Weight: 52.6 kg (116 lb)   Height: 163.8 cm (5' 4.5)     Body mass index is 19.6 kg/m?SABRA     Past Medical History  Patient Active Problem List    Diagnosis Date Noted    Coronary artery disease 03/14/2009     May 2002 presented with chest discomfort and underwent coronary angiography on 12/04/2000. This revealed a 50-60 percent stenosis in the obtuse marginal branch of the left circumflex coronary artery which did not require intervention. Post procedure complication with total occlusion of the right external iliac artery which required surgical repair.      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      History of tobacco use 03/14/2009       Review of Systems   Constitutional: Negative.   HENT: Negative.     Eyes: Negative.    Cardiovascular: Negative.    Respiratory: Negative.     Endocrine: Negative.    Hematologic/Lymphatic: Negative.    Skin: Negative.    Musculoskeletal: Positive for joint pain (left shoulder).   Gastrointestinal: Negative.    Genitourinary: Negative.    Neurological:  Positive for headaches and paresthesias (left arm; comes and goes).   Psychiatric/Behavioral: Negative.     Allergic/Immunologic: Negative.        Physical Exam  GENERAL: The patient is well developed, well nourished, resting comfortably and in no distress.    HEENT: No facial abnormalities noted.  NECK: There is no jugular venous distension. Carotids are palpable and without bruits. There is no thyroid enlargement.    Chest: Lung fields are clear to auscultation. There are no wheezes or crackles.    CV: There is a regular rhythm. The first and second heart sounds are normal. There are no murmurs, gallops or rubs. Her apical heart rate is 68 BPM.    ABD: The abdomen is soft and supple with normal bowel sounds. There is no hepatosplenomegaly, ascites, tenderness, masses or bruits.    Neuro: There are no focal motor defects. Ambulation is normal. Cognitive function appears normal.    Ext: There is no edema or evidence of deep vein thrombosis. Peripheral pulses are satisfactory.  Still  has considerable contracture of right hand.    SKIN: There are no rashes and no cellulitis    PSYCH: The patient is calm, rationale and oriented.     Cardiovascular Studies  Her twelve-lead ECG from the emergency department performed on 06/27/2024 shows normal sinus rhythm with a heart rate of 68 bpm.  Possible left atrial enlargement is noted.  Cardiovascular Health Factors  Vitals BP Readings from Last 3 Encounters:   06/28/24 (!) 145/90   01/21/24 130/78   03/12/23 129/81     Wt Readings from Last 3 Encounters:   06/28/24 52.6 kg (116 lb)   01/21/24 52.9 kg (116 lb 9.6 oz)   03/12/23 53.5 kg (118 lb)     BMI Readings from Last 3 Encounters:   06/28/24 19.60 kg/m?   01/21/24 19.71 kg/m?   03/12/23 19.94 kg/m?      Smoking Tobacco Use History[1]   Lipid Profile Cholesterol   Date Value Ref Range Status   12/02/2023 180 Final     HDL   Date Value Ref Range Status   12/02/2023 84  Final     LDL   Date Value Ref Range Status   12/02/2023 87  Final     Triglycerides   Date Value Ref Range Status   12/02/2023 47  Final      Blood Sugar No results found for: HGBA1C  Glucose   Date Value Ref Range Status   06/27/2024 94  Final   12/02/2023 103  Final   11/27/2022 95  Final         Problems Addressed Today  Hypertension.    Assessment and Plan   1) the left shoulder discomfort experienced by JoanneSteinhilber on 06/26/2024 sounds musculoskeletal and her intermittent left arm numbness sounds neuropathic.  However, in order to assess for any objective evidence of myocardial ischemia I have asked her to obtain a regadenoson thallium stress test.  If her discomfort continues she can be referred to the sports medicine/pain clinic at [facility].  2) alternatives for the treatment of hypertension were reviewed with the patient.  She can continue taking 2.5 mg of ramipril  which appears to be controlling her blood pressure most the time.  If her systolic blood pressure exceeds 150 mmHg for 2 hours consecutively she wanted to take amlodipine  2.5 mg on a as needed basis.  3) cardiovascular risk factor management was reviewed in detail.  I have asked her to return for follow-up in approximately 3 months.The total time spent during this interview and exam with preparation and chart review was 30 minutes.         Current Medications (including today's revisions)   Bifidobacterium infantis (ALIGN PO) Take  by mouth daily.    cefdinir (OMNICEF) 300 mg capsule Take one capsule by mouth twice daily. (Patient not taking: Reported on 06/28/2024)    MULTIVITAMIN PO Take 1 tablet by mouth daily.    pantoprazole DR (PROTONIX) 20 mg tablet Take one tablet by mouth daily.    ramipriL  (ALTACE ) 2.5 mg capsule Take one capsule by mouth twice daily.                   [1]   Social History  Tobacco Use   Smoking Status Former    Passive exposure: Past   Smokeless Tobacco Never   Tobacco Comments    early 54's

## 2024-06-28 NOTE — Patient Instructions [37]
 Thank you for visiting our office today.    We would like to make the following medication adjustments:      Amlodipine  2.5mg  as needed for systolic blood pressure greater than longer than 2 hrs       Otherwise continue the same medications as you have been doing.          We will be pursuing the following tests after your appointment today:       Orders Placed This Encounter    amLODIPine  (NORVASC ) 2.5 mg tablet    REGADENOSON MPI STRESS TEST         We will plan to see you back in 3 months.  Please call us  in the meantime with any questions or concerns.        Please allow 5-7 business days for our providers to review your results. All normal results will go to MyChart. If you do not have Mychart, it is strongly recommended to get this so you can easily view all your results. If you do not have mychart, we will attempt to call you once with normal lab and testing results. If we cannot reach you by phone with normal results, we will send you a letter.  If you have not heard the results of your testing after one week please give us  a call.       Your Cardiovascular [facility] [place] [facility] [place], [place], [place], and [place])  phone number is [phone].        [facility]    Nuclear Stress Test Instructions    Your cardiologist has asked that you have a nuclear stress test (also known as a Myocardial Perfusion Imaging (MPI) test.    This evaluation of your heart muscle consists of two sets of nuclear images and either a Treadmill stress test or a chemical stress test, decided by you and your physician.    You will get an IV placed in your arm for the test.    You will need to be able to raise your arm up by your head for about 20 minutes and lie on your back for about 10 minutes.  Please discuss this with your doctor or talk with the nuclear technologist or nurse if these are a problem for you.    It is recommended not to schedule any other appointment for the same day.      Wear comfortable clothing and walking shoes if you are walking on the treadmill.  Women should wear shorts or comfortable pants instead of dresses.   Sweatshirts or T-shirts work really well for imaging.    If you have a Zio Patch cardiac monitor in place, please reschedule your nuclear stress test after your monitoring period is complete. If you would like to proceed with the nuclear stress test during the monitoring time, the patch will have to be removed and the data will be lost; we can place a new patch following testing.       If you have a cardiac monitor with replacement patches, please inform the nurse prior to your appointment.  The monitor will need to be removed during testing. Please bring replacement patches with you so that we can resume monitoring following your test.     There may be enough time to leave to get a snack after the stress portion of the test.   The Technologist will tell you what time to return for the second set of images.  PLEASE NO CAFFEINE 24 HOURS PRIOR TO TEST:  Examples include coffee, tea, decaffeinated drinks, colas, [facility], Dr. Nunzio.  Some orange sodas and root beers have caffeine, please check.  No energy drinks, Excedrin, Midol, or any foods CONTAINING CHOCOLATE.   Consuming Caffeine may postpone your test.    PLEASE DO NOT EAT OR DRINK THE MORNING OF YOUR TEST.  Water is ok to drink with your morning medications.    Please hold these medications the day of test:      DIABETIC PATIENTS:  if insulin dependent:  please take one third of your insulin with two pieces of dry toast and a small juice.  Bring remaining 2/3   insulin and oral diabetic medications with you to your test.    PLEASE NO TOBACCO PRODUCTS BETWEEN SCANS  You will NOT need a driver for this test.  But are welcome to bring a visitor with you.   Visitors will not be able to accompany you back to stress room.   Please do not bring children to the nuclear stress test.    TEST FINDINGS:   You will receive the results of the test within 7 business days of completion of this test by telephone. If you have any questions concerning your nuclear stress test or if you do not hear from your cardiologist/or nurse with 7 business days, please call our office.

## 2024-06-29 ENCOUNTER — Encounter
Admit: 2024-06-29 | Discharge: 2024-06-29 | Payer: MEDICARE | Primary: Student in an Organized Health Care Education/Training Program

## 2024-06-29 NOTE — Telephone Encounter [36]
 Called patient to give instructions for upcoming stress test.     - NO CAFFINE 24 hours prior to appointment - No coffee, decaffeinated   drinks, tea, sodas, energy drinks, or any foods/beverages CONTAINING   CHOCOLATE.    - NOTHING TO EAT OR DRINK AFTER MIDNIGHT - NO BREAKFAST   MORNING OF THEIR TEST    -HOLD ALL OVER-THE-COUNTER MEDICATIONS, VITAMINS, MINERALS,   AND SUPPLEMENTS.      Patient verbalized understanding and all questions where answered. Patient was given nurse line number if any questions arise

## 2024-07-01 ENCOUNTER — Encounter
Admit: 2024-07-01 | Discharge: 2024-07-01 | Payer: MEDICARE | Primary: Student in an Organized Health Care Education/Training Program

## 2024-07-01 ENCOUNTER — Ambulatory Visit
Admit: 2024-07-01 | Discharge: 2024-07-01 | Payer: MEDICARE | Primary: Student in an Organized Health Care Education/Training Program

## 2024-07-04 ENCOUNTER — Encounter
Admit: 2024-07-04 | Discharge: 2024-07-04 | Payer: MEDICARE | Primary: Student in an Organized Health Care Education/Training Program
